# Patient Record
Sex: Male | Born: 1935 | Race: White | Hispanic: No | Marital: Married | Smoking: Former smoker
Health system: Southern US, Community
[De-identification: ages and names within clinical notes are randomized; demographics above are authoritative.]

## PROBLEM LIST (undated history)

## (undated) DIAGNOSIS — N400 Enlarged prostate without lower urinary tract symptoms: Secondary | ICD-10-CM

## (undated) DIAGNOSIS — I472 Ventricular tachycardia, unspecified: Secondary | ICD-10-CM

## (undated) DIAGNOSIS — R55 Syncope and collapse: Secondary | ICD-10-CM

## (undated) DIAGNOSIS — I679 Cerebrovascular disease, unspecified: Secondary | ICD-10-CM

## (undated) DIAGNOSIS — I10 Essential (primary) hypertension: Secondary | ICD-10-CM

## (undated) DIAGNOSIS — I459 Conduction disorder, unspecified: Secondary | ICD-10-CM

## (undated) HISTORY — DX: Essential (primary) hypertension: I10

## (undated) HISTORY — DX: Ventricular tachycardia: I47.2

## (undated) HISTORY — PX: COLONOSCOPY: SHX174

## (undated) HISTORY — DX: Syncope and collapse: R55

## (undated) HISTORY — DX: Cerebrovascular disease, unspecified: I67.9

## (undated) HISTORY — DX: Conduction disorder, unspecified: I45.9

## (undated) HISTORY — DX: Benign prostatic hyperplasia without lower urinary tract symptoms: N40.0

## (undated) HISTORY — DX: Ventricular tachycardia, unspecified: I47.20

---

## 1996-03-28 DIAGNOSIS — R55 Syncope and collapse: Secondary | ICD-10-CM

## 1996-03-28 HISTORY — DX: Syncope and collapse: R55

## 2002-02-07 ENCOUNTER — Other Ambulatory Visit: Admission: RE | Admit: 2002-02-07 | Discharge: 2002-02-07 | Payer: Self-pay | Admitting: Dermatology

## 2006-09-05 ENCOUNTER — Ambulatory Visit: Payer: Self-pay | Admitting: Internal Medicine

## 2006-09-05 ENCOUNTER — Encounter (INDEPENDENT_AMBULATORY_CARE_PROVIDER_SITE_OTHER): Payer: Self-pay | Admitting: Internal Medicine

## 2006-09-05 ENCOUNTER — Ambulatory Visit (HOSPITAL_COMMUNITY): Admission: RE | Admit: 2006-09-05 | Discharge: 2006-09-05 | Payer: Self-pay | Admitting: Internal Medicine

## 2007-01-26 ENCOUNTER — Ambulatory Visit: Payer: Self-pay | Admitting: Cardiology

## 2007-02-14 ENCOUNTER — Ambulatory Visit: Payer: Self-pay | Admitting: Cardiology

## 2007-02-19 ENCOUNTER — Ambulatory Visit (HOSPITAL_COMMUNITY): Admission: RE | Admit: 2007-02-19 | Discharge: 2007-02-19 | Payer: Self-pay | Admitting: Cardiology

## 2007-02-21 ENCOUNTER — Ambulatory Visit: Payer: Self-pay | Admitting: Cardiology

## 2007-03-05 ENCOUNTER — Ambulatory Visit: Payer: Self-pay | Admitting: Cardiology

## 2007-05-08 ENCOUNTER — Ambulatory Visit: Payer: Self-pay | Admitting: Cardiology

## 2008-05-05 ENCOUNTER — Encounter: Payer: Self-pay | Admitting: Cardiology

## 2008-05-05 LAB — CONVERTED CEMR LAB
AST: 18 units/L
Albumin: 4.7 g/dL
Alkaline Phosphatase: 50 units/L
BUN: 22 mg/dL
Chloride: 96 meq/L
Cholesterol: 124 mg/dL
HCT: 40.9 %
LDL Cholesterol: 62 mg/dL
MCV: 92 fL
Potassium: 4 meq/L
Sodium: 139 meq/L
WBC: 5.3 10*3/uL

## 2008-06-13 ENCOUNTER — Inpatient Hospital Stay (HOSPITAL_COMMUNITY): Admission: AD | Admit: 2008-06-13 | Discharge: 2008-06-15 | Payer: Self-pay | Admitting: Internal Medicine

## 2008-07-21 ENCOUNTER — Ambulatory Visit (HOSPITAL_COMMUNITY): Admission: RE | Admit: 2008-07-21 | Discharge: 2008-07-21 | Payer: Self-pay | Admitting: Internal Medicine

## 2008-09-17 ENCOUNTER — Encounter (INDEPENDENT_AMBULATORY_CARE_PROVIDER_SITE_OTHER): Payer: Self-pay | Admitting: *Deleted

## 2009-05-14 ENCOUNTER — Other Ambulatory Visit: Admission: RE | Admit: 2009-05-14 | Discharge: 2009-05-14 | Payer: Self-pay | Admitting: General Surgery

## 2009-05-18 ENCOUNTER — Encounter (INDEPENDENT_AMBULATORY_CARE_PROVIDER_SITE_OTHER): Payer: Self-pay | Admitting: *Deleted

## 2009-05-18 LAB — CONVERTED CEMR LAB
AST: 18 units/L
Albumin: 4.6 g/dL
BUN: 16 mg/dL
Cholesterol: 122 mg/dL
Glucose, Bld: 115 mg/dL
HDL: 42 mg/dL
Sodium: 140 meq/L
Total Protein: 7 g/dL
Triglycerides: 98 mg/dL

## 2009-06-16 DIAGNOSIS — N400 Enlarged prostate without lower urinary tract symptoms: Secondary | ICD-10-CM | POA: Insufficient documentation

## 2009-06-18 ENCOUNTER — Encounter: Payer: Self-pay | Admitting: Cardiology

## 2009-06-18 DIAGNOSIS — Z8669 Personal history of other diseases of the nervous system and sense organs: Secondary | ICD-10-CM

## 2009-06-18 DIAGNOSIS — E785 Hyperlipidemia, unspecified: Secondary | ICD-10-CM

## 2009-06-18 DIAGNOSIS — I459 Conduction disorder, unspecified: Secondary | ICD-10-CM

## 2009-06-19 ENCOUNTER — Ambulatory Visit: Payer: Self-pay | Admitting: Cardiology

## 2009-06-19 DIAGNOSIS — R079 Chest pain, unspecified: Secondary | ICD-10-CM

## 2009-06-25 ENCOUNTER — Encounter (INDEPENDENT_AMBULATORY_CARE_PROVIDER_SITE_OTHER): Payer: Self-pay | Admitting: *Deleted

## 2009-06-25 ENCOUNTER — Ambulatory Visit: Payer: Self-pay | Admitting: Cardiology

## 2009-06-26 ENCOUNTER — Encounter (INDEPENDENT_AMBULATORY_CARE_PROVIDER_SITE_OTHER): Payer: Self-pay | Admitting: *Deleted

## 2009-07-08 ENCOUNTER — Ambulatory Visit: Payer: Self-pay | Admitting: Cardiology

## 2009-12-28 ENCOUNTER — Ambulatory Visit: Payer: Self-pay | Admitting: Cardiology

## 2009-12-28 DIAGNOSIS — R002 Palpitations: Secondary | ICD-10-CM

## 2009-12-30 ENCOUNTER — Ambulatory Visit: Payer: Self-pay | Admitting: Cardiology

## 2009-12-30 ENCOUNTER — Encounter: Payer: Self-pay | Admitting: Cardiology

## 2009-12-30 ENCOUNTER — Ambulatory Visit (HOSPITAL_COMMUNITY): Admission: RE | Admit: 2009-12-30 | Discharge: 2009-12-30 | Payer: Self-pay | Admitting: Cardiology

## 2010-04-18 ENCOUNTER — Encounter: Payer: Self-pay | Admitting: Cardiology

## 2010-04-29 NOTE — Assessment & Plan Note (Signed)
Summary: F/U GXT TO BE DONE 3/30/TG      Allergies Added:   Visit Type:  Follow-up Primary Provider:  Dr. Carylon Perches   History of Present Illness: Patrick [name] returns to the office as scheduled for continued assessment and treatment of sinus bradycardia, chest discomfort and exercise induced ventricular tachycardia.  Since his last visit, he has done very well.  Chest discomfort has resolved.  He now believes this was related to a upper respiratory infection.  His treadmill test was generally good with no ischemic EKG changes, and no elicited chest discomfort, a good heart rate response, good exercise tolerance but a few runs of nonsustained ventricular tachycardia near peak exercise.  He detected palpitations at the time of his stress test, but does not recall those occurring with his usual daily activities.  Current Medications (verified): 1)  Chlorthalidone 25 Mg Tabs (Chlorthalidone) .... Take 1/2 Tablet Daily 2)  Amlodipine Besylate 5 Mg Tabs (Amlodipine Besylate) .... Take 1 Tablet By Mouth Once Daily 3)  Flomax 0.4 Mg Caps (Tamsulosin Hcl) .... Take 1 Tab Daily 4)  Aspir-Low 81 Mg Tbec (Aspirin) .... Take 1 Tab Daily 5)  Simvastatin 40 Mg Tabs (Simvastatin) .... Take 1 Tab Daily  Allergies (verified): 1)  ! Diltiazem Hcl Er Beads  Past History:  Past Medical History: Syncope-1998; associated with PSVT Conduction system disease-sinus bradycardia; junctional rhythm but when treated with diltiazem Exercise-induced ventricular tachycardia CEREBROVASCULAR DISEASE-plaque without focal disease on duplex scanning in 11/08 HYPERTENSION (ICD-401.9) BENIGN PROSTATIC HYPERTROPHY, HX OF (ICD-V13.8) AODM-diet controlled  Review of Systems  The patient denies chest pain, syncope, dyspnea on exertion, and peripheral edema.    Vital Signs:  Patient profile:   77 year old male Weight:      197 pounds Pulse rate:   49 / minute BP sitting:   122 / 43  (right arm)  Vitals Entered  By: Dreama Saa, CNA (July 08, 2009 11:26 AM)  Physical Exam  General:    Proportionate weight and height; well developed; no acute distress: Neck-No JVD; no carotid bruits: Lungs-No tachypnea, no rales; no rhonchi; no wheezes: Cardiovascular-normal PMI; normal S1 and S2: minimal systolic murmur Extremities- no edema:     Impression & Recommendations:  Problem # 1:  CHEST PAIN (ICD-786.50) Symptoms have resolved.  Is negative stress test at good workload and heart rate is reassuring and tends to exclude important ischemic heart disease.  He will call him should symptoms recur or should he develop other worrisome symptoms such as dyspnea or syncope.  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  I considered treatment with beta blocker for his exercise-induced ventricular tachycardia, but this is would be of uncertain benefit and might lead to an intolerable bradycardia.  Since he does not exercise to the level at which his arrhythmia occurred, I do not believe that treatment is necessary unless he develops worrisome symptoms such as syncope.  Problem # 3:  HYPERLIPIDEMIA (ICD-272.4) Recent laboratory results from Dr. Alonza Smoker office were obtained and reviewed.  Control of hyperlipidemia is excellent.  Current medication will be continued.  Control of hypertension is also superb.  Patrick [name] was congratulated on his efforts to maintain his health.  I will plan to see him again in one year.  Patient Instructions: 1)  Your physician recommends that you schedule a follow-up appointment in: 1  year 2)  Your physician recommends that you continue on your current medications as directed. Please refer to the Current Medication list given to you today.

## 2010-04-29 NOTE — Assessment & Plan Note (Signed)
Summary: per pt request/  having some problems /tg    Visit Type:  Follow-up Primary Provider:  Dr. Carylon Perches   History of Present Illness: [name] is a pleasant 77 y/o CM with history of exercise induced ventricular tachycardia, hypertension, conduction system disease with junctional rhythm on cardiazem and daibetes who is here for follow-up after being seen by Dr. Dietrich Pates in April of 2011.  He continues to have frequent daily palpations with increased frequency during exercise. States that when he climbed some stairs recently he felt his heart racing and thumping, caused near syncope-"everything went white in front of my eyes and I could not see well and felt weakenss.". He rested for about a minute and the sensation passed.  He did not have repeated symptoms of near syncope since that time.  He has recurrent palpatations almost daily.    He had a stress test in March of 2011 demonstrating  asymptomatic PVCs and nonsustained VT during exercise; patient's typical exertional chest discomfort not elicited despite a high perceived level of exercise. Review of records do no show that he has had an ECHO in the past or TSH drawn.  Current Medications (verified): 1)  Chlorthalidone 25 Mg Tabs (Chlorthalidone) .... Take 1/2 Tablet Daily 2)  Amlodipine Besylate 5 Mg Tabs (Amlodipine Besylate) .... Take 1 Tablet By Mouth Once Daily 3)  Flomax 0.4 Mg Caps (Tamsulosin Hcl) .... Take 1 Tab Daily 4)  Aspir-Low 81 Mg Tbec (Aspirin) .... Take 1 Tab Daily 5)  Simvastatin 40 Mg Tabs (Simvastatin) .... Take 1 Tab Daily  Allergies: 1)  ! Diltiazem Hcl Er Beads  Comments:  Nurse/Medical Assistant: reviewed med list with patient no meds no list  Past History:  Past medical, surgical, family and social histories (including risk factors) reviewed, and no changes noted (except as noted below).  Past Medical History: Reviewed history from 07/08/2009 and no changes required. Syncope-1998; associated  with PSVT Conduction system disease-sinus bradycardia; junctional rhythm but when treated with diltiazem Exercise-induced ventricular tachycardia CEREBROVASCULAR DISEASE-plaque without focal disease on duplex scanning in 11/08 HYPERTENSION (ICD-401.9) BENIGN PROSTATIC HYPERTROPHY, HX OF (ICD-V13.8) AODM-diet controlled  Past Surgical History: Reviewed history from 06/16/2009 and no changes required. colonoscopy cath 10 yrs ago  Family History: Reviewed history from 06/18/2009 and no changes required. Father:deceased age 12 with Alzheimer's Mother:deceased age 66 brain anyurism Siblings: 1 sister-healthy  Social History: Reviewed history from 06/16/2009 and no changes required. Retired  Married  Regular Exercise - yes patient has 1 child  Review of Systems       Palpatations   Vital Signs:  Patient profile:   77 year old male Weight:      193 pounds BMI:     26.27 Pulse rate:   56 / minute BP sitting:   145 / 72  (right arm)  Vitals Entered By: Dreama Saa, CNA (December 28, 2009 11:37 AM)  Physical Exam  General:  Well developed, well nourished, in no acute distress. Neck:  Neck supple, no JVD. No masses, thyromegaly or abnormal cervical nodes. Lungs:  Clear bilaterally to auscultation and percussion. Heart:  Bradycardic with occasional extra systoles. No MRG. Abdomen:  Bowel sounds positive; abdomen soft and non-tender without masses, organomegaly, or hernias noted. No hepatosplenomegaly. Msk:  Back normal, normal gait. Muscle strength and tone normal. Pulses:  pulses normal in all 4 extremities Extremities:  No clubbing or cyanosis. Neurologic:  Alert and oriented x 3. Psych:  Normal affect.   EKG  Procedure  date:  12/28/2009  Findings:      Sinus bradycardia with rate of:57 bpm  PVC's noted.    Impression & Recommendations:  Problem # 1:  PALPITATIONS (ICD-785.1) Plan echocardiogram for LV fx with recurrent palpatations.  Review of labs drawn in  march do not show TSH.  Would recommend this on next visit to primary if not done recently.  Will review these records and make further recommendations after ECHO His updated medication list for this problem includes:    Amlodipine Besylate 5 Mg Tabs (Amlodipine besylate) .Marland Kitchen... Take 1 tablet by mouth once daily    Aspir-low 81 Mg Tbec (Aspirin) .Marland Kitchen... Take 1 tab daily  Problem # 2:  HYPERTENSION (ICD-401.9) Moderately controlled on current medication regimine.  Last BP on office visit. 122/43  No changes in medications at this time.    Chlorthalidone 25 Mg Tabs (Chlorthalidone) .Marland Kitchen... Take 1/2 tablet daily    Amlodipine Besylate 5 Mg Tabs (Amlodipine besylate) .Marland Kitchen... Take 1 tablet by mouth once daily    Aspir-low 81 Mg Tbec (Aspirin) .Marland Kitchen... Take 1 tab daily  Other Orders: 2-D Echocardiogram (2D Echo)  Patient Instructions: 1)  Your physician recommends that you schedule a follow-up appointment in: 6 months 2)  Your physician recommends that you continue on your current medications as directed. Please refer to the Current Medication list given to you today. 3)  Your physician has requested that you have an echocardiogram.  Echocardiography is a painless test that uses sound waves to create images of your heart. It provides your doctor with information about the size and shape of your heart and how well your heart's chambers and valves are working.  This procedure takes approximately one hour. There are no restrictions for this procedure.

## 2010-04-29 NOTE — Letter (Signed)
Summary: [place] [employer] [facility], [facility] at [facility]  [address], [place] [address]   Phone: [phone]  Fax: [phone]      June 25, 2009 MRN: [id number]   GERADO NABERS [address] [place], [place]  [address]   Dear Mr. Plamondon,  Your test ordered by Selena Batten has been reviewed by your physician (or physician assistant) and was found to be normal or stable. Your physician (or physician assistant) felt no changes were needed at this time.  ____ Echocardiogram  _x___ Cardiac Stress Test  ____ Lab Work  ____ Peripheral vascular study of arms, legs or neck  ____ CT scan or X-ray  ____ Lung or Breathing test  ____ Other:  No change in medical treatment at this time, per Dr. Dietrich Pates.  Thank you, Charlene Cowdrey Allyne Gee RN    Simms Bing, MD, Lenise Arena.C.Gaylord Shih, MD, F.A.C.C Lewayne Bunting, MD, F.A.C.C Nona Dell, MD, F.A.C.C Charlton Haws, MD, Lenise Arena.C.C

## 2010-04-29 NOTE — Assessment & Plan Note (Signed)
Summary: [name]      Allergies Added:   Visit Type:  Follow-up Primary Provider:  Dr. Carylon Perches   History of Present Illness: [name] is seen at his request today for evaluation of chest discomfort.  This gentleman was due back to see me more than one year ago for continuing assessment and treatment of cardiovascular risk factors including hypertension and dyslipidemia.  Since he was feeling fine, he elected not to keep that appointment, a decision with which I could not really take issue.  In recent months, he has noted mid substernal chest burning with moderate exertion.  There is no associated dyspnea or diaphoresis.  Symptoms resolved within a matter of minutes with rest.  On one occasion, he felt compelled to continue exercising for a minute or 2, and discomfort continued.  Blood pressure control has been good.  He is unaware of recent cholesterol values.  A lipid profile one year ago was excellent.  On one occasion, [place] not exercising to any significant extent, he developed a whiteout of his vision, similar to symptoms that he described the last time I evaluated him.  He assumed a kneeling position, and symptoms resolved.  EKG  Procedure date:  06/19/2009  Findings:      Normal sinus rhythm First degree AV block Frequent PACs Otherwise normal Comparison with prior study of 01/26/07, PACs are now present.   Current Medications (verified): 1)  Chlorthalidone 25 Mg Tabs (Chlorthalidone) .... Take 1/2 Tablet Daily 2)  Amlodipine Besylate 5 Mg Tabs (Amlodipine Besylate) .... Take 1 Tablet By Mouth Once Daily 3)  Flomax 0.4 Mg Caps (Tamsulosin Hcl) .... Take 1 Tab Daily 4)  Aspir-Low 81 Mg Tbec (Aspirin) .... Take 1 Tab Daily 5)  Simvastatin 40 Mg Tabs (Simvastatin) .... Take 1 Tab Daily  Allergies (verified): 1)  ! Diltiazem Hcl Er Beads  Past History:  PMH, FH, and Social History reviewed and updated.  Review of Systems       see  history of present illness.  Vital Signs:  Patient profile:   77 year old male Height:      72 inches Weight:      198 pounds BMI:     26.95 Pulse rate:   45 / minute BP sitting:   145 / 72  (right arm)  Vitals Entered By: Dreama Saa, CNA (June 19, 2009 2:23 PM)  Physical Exam  General:    Proportionate weight and height; well developed; no acute distress: And a bruise and a long and a full   Neck-No JVD; no carotid bruits: Lungs-No tachypnea, no rales; no rhonchi; no wheezes: Cardiovascular-normal PMI; normal S1 and S2: minimal systolic murmur Abdomen-BS normal; soft and non-tender without masses or organomegaly:  Musculoskeletal-No deformities, no cyanosis or clubbing: Neurologic-Normal cranial nerves; symmetric strength and tone:  Skin-Warm, no significant lesions: Extremities-Nl distal pulses; no edema:   EKG cover  Impression & Recommendations:  Problem # 1:  CHEST PAIN (ICD-786.50) By patient's description, his chest discomfort is consistent with angina.  He underwent cardiac catheterization in 1998 at which time no coronary disease was present.  We will proceed with a standard treadmill stress test in an attempt to rule out ischemia and to assess exercise capacity.  Problem # 2:  HYPERTENSION (ICD-401.9) Blood pressure control is good.  Patient does have borderline significant orthostatic hypotension with at least one episode of symptoms compatible with cerebral hypoperfusion.  This is similar to evaluation  some years ago.  He already is aware of behavioral techniques to mitigate both symptoms and the danger of syncope and will continue to utilize these.  He was also cautioned about maintaining adequate fluid and salt intake and avoided environments that will cause significant dehydration.  Problem # 3:  HYPERLIPIDEMIA (ICD-272.4)  Lipid profile from one year ago was excellent. Current medication will be continued.  If stress testing is negative or low risk, I will  plan to see this nice gentleman again in one year.  Other Orders: Treadmill (Treadmill) T-CBC w/Diff ([id number]) T-Comprehensive Metabolic Panel [phone]) T-TSH ([phone])  Patient Instructions: 1)  Your physician recommends that you schedule a follow-up appointment in: after testing 2)  Your physician recommends that you return for lab work in: today 3)  Your physician has requested that you have an exercise tolerance test.  For further information please visit [email or web address]  Please also follow instruction sheet, as given.

## 2010-04-29 NOTE — Miscellaneous (Signed)
Summary: labs cmp,lipids,A1c,05/18/2009  Clinical Lists Changes  Observations: Added new observation of CALCIUM: 9.0 mg/dL (16/12/9602 5:40) Added new observation of ALBUMIN: 4.6 g/dL (98/01/9146 8:29) Added new observation of PROTEIN, TOT: 7.0 g/dL (56/21/3086 5:78) Added new observation of SGPT (ALT): 16 units/L (05/18/2009 8:38) Added new observation of SGOT (AST): 18 units/L (05/18/2009 8:38) Added new observation of ALK PHOS: 59 units/L (05/18/2009 8:38) Added new observation of CREATININE: 0.95 mg/dL (46/96/2952 8:41) Added new observation of BUN: 16 mg/dL (32/44/0102 7:25) Added new observation of BG RANDOM: 115 mg/dL (36/64/4034 7:42) Added new observation of CO2 PLSM/SER: 29 meq/L (05/18/2009 8:38) Added new observation of CL SERUM: 101 meq/L (05/18/2009 8:38) Added new observation of K SERUM: 3.8 meq/L (05/18/2009 8:38) Added new observation of NA: 140 meq/L (05/18/2009 8:38) Added new observation of LDL: 60 mg/dL (59/56/3875 6:43) Added new observation of HDL: 42 mg/dL (32/95/1884 1:66) Added new observation of TRIGLYC TOT: 98 mg/dL (09/25/1599 0:93) Added new observation of CHOLESTEROL: 122 mg/dL (23/55/7322 0:25) Added new observation of HGBA1C: 140 % (05/18/2009 8:38)

## 2010-07-08 LAB — COMPREHENSIVE METABOLIC PANEL
AST: 27 U/L (ref 0–37)
Alkaline Phosphatase: 70 U/L (ref 39–117)
BUN: 19 mg/dL (ref 6–23)
Creatinine, Ser: 1.08 mg/dL (ref 0.4–1.5)
GFR calc Af Amer: >60 mL/min (ref >60)
GFR calc non Af Amer: >60 mL/min (ref >60)
Glucose, Bld: 195 mg/dL — ABNORMAL HIGH (ref 70–99)
Sodium: 129 mEq/L — ABNORMAL LOW (ref 135–145)
Total Protein: 7.4 g/dL (ref 6.0–8.3)

## 2010-07-08 LAB — GLUCOSE, CAPILLARY
Glucose-Capillary: 215 mg/dL — ABNORMAL HIGH (ref 70–99)
Glucose-Capillary: 95 mg/dL (ref 70–99)

## 2010-07-08 LAB — BASIC METABOLIC PANEL
BUN: 14 mg/dL (ref 6–23)
Calcium: 8.6 mg/dL (ref 8.4–10.5)
Chloride: 99 mEq/L (ref 96–112)
Creatinine, Ser: 0.99 mg/dL (ref 0.4–1.5)
GFR calc Af Amer: >60 mL/min (ref >60)
GFR calc non Af Amer: >60 mL/min (ref >60)
Glucose, Bld: 124 mg/dL — ABNORMAL HIGH (ref 70–99)
Potassium: 3.9 mEq/L (ref 3.5–5.1)

## 2010-07-08 LAB — CULTURE, BLOOD (ROUTINE X 2)
Culture: NO GROWTH
Culture: NO GROWTH
Report Status: 3242010
Report Status: 3242010

## 2010-07-08 LAB — DIFFERENTIAL
Lymphs Abs: 0.4 10*3/uL — ABNORMAL LOW (ref 0.7–4.0)
Monocytes Absolute: 0.5 10*3/uL (ref 0.1–1.0)
Neutro Abs: 10.6 10*3/uL — ABNORMAL HIGH (ref 1.7–7.7)
Neutrophils Relative %: 92 % — ABNORMAL HIGH (ref 43–77)

## 2010-07-08 LAB — CBC
HCT: 38.5 % — ABNORMAL LOW (ref 39.0–52.0)
Hemoglobin: 13.3 g/dL (ref 13.0–17.0)
WBC: 11.5 10*3/uL — ABNORMAL HIGH (ref 4.0–10.5)

## 2010-08-10 NOTE — Op Note (Signed)
NAMEMICAH, Patrick [name]               ACCOUNT NO.:  [id number]   MEDICAL RECORD NO.:  [id number]          PATIENT TYPE:  AMB   LOCATION:  DAY                           FACILITY:  [facility]   PHYSICIAN:  Lionel December, M.D.    DATE OF BIRTH:  [birth year]   DATE OF PROCEDURE:  09/05/2006  DATE OF DISCHARGE:                               OPERATIVE REPORT   PROCEDURE:  Colonoscopy.   INDICATION:  [name] is a 77 year old Caucasian male who is referred for  colonoscopy.  He has had polyps removed in the past.  Furthermore, his  family history is positive for colon carcinoma in his mother.  He does  complain of constipation.  He states his stools are getting hard and he  has to strain. He goes every 3-4 days which has been his pattern for  years but the consistency has changed recently.  The procedure risks  were reviewed the patient and informed consent was obtained .The  procedure risks were reviewed with the patient and informed consent was  obtained.   MEDS FOR CONSCIOUS SEDATION:  Demerol 50 mg IV, Versed 5 mg IV.   FINDINGS:  The procedure was performed in the endoscopy suite.  The  patient's vital signs and O2 sat were monitored during the procedure and  remained stable.  The patient was placed in the left lateral position  and rectal examination performed.  No abnormality was noted on external  or digital exam.  The Pentax videoscope was placed in the rectum and  advanced under vision into the sigmoid colon and beyond.  The  preparation was satisfactory.  A very redundant colon.  The scope was  passed into the cecum which was identified by the appendiceal orifice  and ileocecal valve.  There was a single small polyp involving blunt end  cecum which was ablated via cold biopsy.  There was a small diverticulum  at the ascending colon and another one at the hepatic flexure and there  was a polyp on its rim which was ablated via cold biopsy.  A third polyp  was removed from the mid transverse  colon in a similar fashion.  The  rest of the mucosa was unremarkable.  There was another small  diverticulum at the sigmoid colon.  Rectal mucosa was normal.  The scope  was retroflexed to examine the anorectal junction which was  unremarkable.  The endoscope was straightened and withdrawn.  The  patient tolerated the procedure well.   FINAL DIAGNOSIS:  1. Three small polyps ablated via cold biopsy, one from cecum, another      one from hepatic flexure located at the rim of a diverticulum, and      third polyp was at transverse colon.  2. Three small diverticula, one at the ascending colon, the second one      at hepatic flexure, and third one at sigmoid.  3. External hemorrhoids.   RECOMMENDATIONS:  Standard instructions given.  He will continue high  fiber diet and Metamucil, as before.  He will try Colace, 2 tablets at  bedtime for two weeks.  If it does not work, he will use lactulose, 2  tablespoonful q.h.s. or q.o.d., prescription given for one quart with  refills for up to one year.   I will be contacting the patient with results of biopsy and he should  consider having his next colonoscopy in five years for now.      Lionel December, M.D.  Electronically Signed     NR/MEDQ  D:  09/05/2006  T:  09/05/2006  Job:  [id number]   cc:   Kingsley Callander. Ouida Sills, MD  Fax: [phone]

## 2010-08-10 NOTE — Letter (Signed)
March 05, 2007    Kingsley Callander. Ouida Sills, MD  [address]  [place], [place] [address]   RE:  RHYTHM, WIGFALL  MRN:  [id number]  /  DOB:  05-18-1928   Dear Channing Mutters:   Mr. Bossman returns to the office for continued assessment and  treatment of spells that potentially represent cerebral hypoperfusion.  He performed a stress test with good results.  Carotid ultrasound was  negative.  An event recorder was revealing.  He had sinus bradycardiac  typically with frequent PVC and PACs.  However, when he reported  symptoms, he was in a junctional rhythm.   Medications are unchanged from his last visit.   EXAM:  Pleasant gentleman in no acute distress.  The weight is 202, 1  pound more than at his last visit.  Blood pressure 160/60, heart rate 60 and irregular, respirations 16.  NECK:  No jugular venous distension; normal carotid upstrokes without  bruits.  LUNGS:  Clear.  CARDIAC:  Normal first and second heart sounds; modest systolic murmur.  ABDOMEN:  Soft and nontender; no organomegaly.  EXTREMITIES:  1/2+ ankle edema.   Mr. Eckerman has a component of conduction system disease exacerbated by  Diltiazem.  His junctional rhythms are probably not of significant  concern and are not causing hypotension, but they are causing symptoms.  We will stop Diltiazem and add amlodipine and chlorthalidone for his  hypertension.  He will monitor blood pressure at home and return for a  chemistry profile in 1 month, and a return office visit in 2 months.    Sincerely,      Gerrit Friends. Dietrich Pates, MD, [facility]  Electronically Signed    RMR/MedQ  DD: 03/05/2007  DT: 03/05/2007  Job #: [id number]

## 2010-08-10 NOTE — Letter (Signed)
May 08, 2007    Kingsley Callander. Ouida Sills, MD  [address]  [place], [place] [address]   RE:  JABRON, WEESE  MRN:  [id number]  /  DOB:  10-16-1928   Dear Channing Mutters:   Mr. Bolin returns to the office for continued assessment and  treatment of hypertension and dyslipidemia.  Since his last visit, he  has felt fine.  The symptoms that he was experiencing with diltiazem  have resolved.  He had a follow-up chemistry profile, which was normal.  Carotid ultrasound showed atherosclerosis but no focal stenosis.   Medications are unchanged from his last visit except for the  discontinuation of diltiazem and the addition of amlodipine 5 mg daily  and chlorthalidone 12.5 mg daily.   On exam, , a pleasant, proportionate gentleman in no acute distress.  The weight is 200 pounds, 2 pounds less than at his last visit.  Blood  pressure 110/75, heart rate 60 and regular, respirations 18.  NECK:  No jugular venous distention; minimal right carotid bruit.  LUNGS:  Clear.  CARDIAC:  Normal first and second heart sounds; fourth heart sound  present.  ABDOMEN:  Soft and nontender; no organomegaly.  EXTREMITIES:  Trace edema.   IMPRESSION:  Mr. Neis is doing well with his current therapy.  A  recent lipid profile showed an LDL level of 99.  I have asked him to  increase his simvastatin to 40 mg daily for slightly better control but  have assured him that he is doing very well.  I will plan to see this  nice gentleman again in 9 months.    Sincerely,      Gerrit Friends. Dietrich Pates, MD, [facility]  Electronically Signed    RMR/MedQ  DD: 05/08/2007  DT: 05/09/2007  Job #: [id number]

## 2010-08-10 NOTE — Discharge Summary (Signed)
Patrick, [name] NO.:  [id number]   MEDICAL RECORD NO.:  [id number]          PATIENT TYPE:  INP   LOCATION:  A313                          FACILITY:  [facility]   PHYSICIAN:  Kingsley Callander. Ouida Sills, MD       DATE OF BIRTH:  02-Feb-1929   DATE OF ADMISSION:  06/13/2008  DATE OF DISCHARGE:  03/21/2010LH                               DISCHARGE SUMMARY   DISCHARGE DIAGNOSES:  1. Community-acquired pneumonia.  2. Altered mental status due to pneumonia.  3. Type 2 diabetes.  4. Hypertension.  5. Hyperlipidemia.  6. Benign prostatic hypertrophy.  7. History of supraventricular tachycardia.   HOSPITAL COURSE:  This patient is a 77 year old male who presented with  cough, fever, and confusion.  His white count was 11.5 with 92 segs.  His chest x-ray revealed a right upper lobe pneumonia.  He was evaluated  with a CT scan of the brain due to confusion.  This was normal.  Blood  cultures were negative.  He was treated with ceftriaxone and  azithromycin.  His fever resolved.  His mental status cleared promptly.  He was breathing comfortably and oxygenating well and was felt to be  stable for discharge home on the 21st.  He will continue oral courses of  cefuroxime and azithromycin.   DISCHARGE MEDICATIONS:  1. Cefuroxime 500 mg b.i.d. for 8 more days.  2. Azithromycin 250 mg daily for 3 more days.  3. Norvasc 5 mg daily.  4. Chlorthalidone 12.5 mg daily.  5. Zocor 40 mg daily.  6. Hydrocodone p.r.n.   He will be seen in followup in the office in a week.  He will have  followup chest x-ray in 1 month.      Kingsley Callander. Ouida Sills, MD  Electronically Signed     ROF/MEDQ  D:  06/15/2008  T:  06/15/2008  Job:  [id number]

## 2010-08-10 NOTE — H&P (Signed)
NAMEKEASTON, PILE NO.:  [id number]   MEDICAL RECORD NO.:  [id number]          PATIENT TYPE:  INP   LOCATION:  A313                          FACILITY:  [facility]   PHYSICIAN:  Kingsley Callander. Ouida Sills, MD       DATE OF BIRTH:  1929/01/06   DATE OF ADMISSION:  06/13/2008  DATE OF DISCHARGE:  LH                              HISTORY & PHYSICAL   CHIEF COMPLAINT:  Cough.   HISTORY OF PRESENT ILLNESS:  This patient is a 77 year old white male,  who presented with cough and confusion.  He actually had improvement in  his cough recently with some hydrocodone cough syrup.  Symptoms started  about a week ago.  Upon awakening this morning though his wife noted  that he was confused, he seemed weaker yesterday.  He was brought to the  office and was found to have a fever of 100.  He was clearly different  in regard to his mental status.  He had extreme difficulty answering  simple questions.  He coughed, but produced limited sputum.  He denied  pain in the chest.  He had previously experienced a bout of pneumonia in  2002.   PAST MEDICAL HISTORY:  1. Type 2 diabetes.  2. Hypertension.  3. Hyperlipidemia.  4. BPH.  5. SVT.  6. Negative cardiac cath in 1994.   MEDICATIONS:  1. Hydrocodone cough syrup p.r.n.  2. Norvasc 5 mg daily.  3. Chlorthalidone 12.5 mg daily.  4. Zocor 40 mg daily.   ALLERGIES:  None.   SOCIAL HISTORY:  He does not smoke.  He does not drink.  He does not use  recreational drugs.   FAMILY HISTORY:  Mother had colon cancer, but died with a cerebral  aneurysm in 1993.  A sister had uterine cancer.   REVIEW OF SYSTEMS:  Noncontributory.   PHYSICAL EXAMINATION:  VITAL SIGNS:  Blood pressure 140/62, pulse 76,  respirations 16, temperature 100, and weight 198.  GENERAL:  Confused, but in no acute distress.  HEENT:  No scleral icterus.  Nose, TMs, and pharynx are unremarkable.  NECK:  Supple with no lymphadenopathy or thyromegaly.  LUNGS:  Clear.  HEART:   Regular with a grade 1 systolic murmur.  ABDOMEN:  Nontender.  No hepatosplenomegaly.  EXTREMITIES:  No cyanosis, clubbing, or edema.  NEURO:  He told me the month was November.  He has no focal weakness.  His gait is less steady, but he is able to ambulate independently.  His  face is symmetric.  His speech is intact.  LYMPH NODES:  No enlargement.   LABORATORY DATA:  His chest x-ray reveals a right upper lobe pneumonia.  His CT of the brain reveals no acute abnormalities, but he does have an  air-fluid level in his left maxillary sinus suggestive of sinusitis.  White count 11.5; hemoglobin 13.3; and platelets 220,000, 92 segs, 4  lymphs.  Sodium 129, potassium 3.6, glucose 195, BUN 19, creatinine  1.08, albumin 4.4, and calcium 9.2.   IMPRESSION:  1. Community-acquired pneumonia.  His confusion is likely related to  his infection.  He will be treated with IV antibiotics.  Blood      cultures have been obtained.  2. Hyponatremia.  3. Diabetes.  We will treat with sliding scale NovoLog.  4. Hypertension.  5. Hyperlipidemia.  6. Sinusitis.      Kingsley Callander. Ouida Sills, MD  Electronically Signed     ROF/MEDQ  D:  06/13/2008  T:  06/14/2008  Job:  [id number]

## 2010-08-10 NOTE — Letter (Signed)
January 26, 2007    Kingsley Callander. Ouida Sills, MD  [address]  [place], [place] [address]   RE:  GIOVANNE, NICKOLSON  MRN:  [id number]  /  DOB:  10-19-1928   Dear Channing Mutters:   It was my pleasure evaluating Patrick [name] in consultation today at your  request for near-syncope.  As you know, this nice gentleman first  presented approximately 10 years ago following a syncopal spell.  He  reportedly had PSVT at that time and was started on diltiazem.  He also  had an abnormal stress test and proceeded to cardiac catheterization  which was apparently normal.  We are seeking those records.   He subsequently did well but continued to have some spells.  He  describes a whitening of a portion of his visual field.  He is still  able to see some aspects of his environment.  There appears to be some  lightheadedness.  He notes weakness in his legs, but he has not fallen.  He has the sense that he needs to sit down or squat down.  Symptoms pass  within a minute or less.  He has typically had episodes with exertion,  although at times, he exerts himself without difficulty.  He also has  had episodes when he arises from a kneeling or bending position.   Otherwise, he has enjoyed generally excellent health.  He follows a  diabetic diet - I assume this was for fasting hyperglycemia.   PAST SURGICAL HISTORY:  None.   ALLERGIES:  NONE.   PAST MEDICAL HISTORY:  1. Dyslipidemia.  2. BPH.   CURRENT MEDICATIONS:  1. Diltiazem 240 mg daily.  2. Simvastatin 20 mg daily.  3. Aspirin 81 mg daily.  4. Flomax 0.4 mg daily.   SOCIAL HISTORY:  Retired.  Maintains an active lifestyle, including  walking on flat treadmill which does not bother him.  He is married with  1 adult child.   FAMILY HISTORY:  Father died at age 33 due to Alzheimer's.  Mother died  at 6 due to a brain aneurysm.  He has 1 sister who is alive and well.   REVIEW OF SYSTEMS:  Notable for the need for corrective lenses,  occasional brief  palpitations without other symptoms, intermittent  constipation.  All other systems reviewed and are negative.   PHYSICAL EXAMINATION:  GENERAL:  Pleasant gentleman in no acute  distress.  VITAL SIGNS:  The weight is 201.  Blood pressure 140/60, heart rate 60  and regular, respirations 15.  HEENT:  Mild arcus.  EOMs full.  Pupils are equal, round, and reactive  to light.  Normal oral mucosa.  NECK:  No jugular venous distension.  Normal carotid upstrokes without  bruits.  ENDOCRINE:  No thyromegaly.  HEMATOPOIETIC:  No adenopathy.  SKIN:  No significant lesions.  PSYCHIATRIC:  Alert and oriented.  Normal affect.  LUNGS:  Clear.  CARDIAC:  Normal first and second heart sounds.  Fourth heart sound  present.  Moderate basilar systolic ejection murmur.  ABDOMEN:  Soft and nontender.  No organomegaly.  EXTREMITIES:  No edema.  Normal distal pulses.  NEUROMUSCULAR:  Normal strength and tone.  Normal reflexes.   EKG:  Sinus bradycardia, left atrial enlargement, first-degree AV block,  somewhat prominent U waves, no prior tracing for comparison.   IMPRESSION:  Patrick [name] has spells that are somewhat difficult to  characterize.  This could represent cerebral hypoperfusion  with visual  disturbance and near-syncope, but I  am not entirely convinced.  We will  start with a carotid ultrasound study, a stress test, and an event  recorder.  Patrick [name] will return to the office after those tests have  been completed to determine if an etiology is suggested and whether  additional testing is warranted.   Thank you for sending this nice gentleman back to [place] after a long  hiatus.    Sincerely,      Gerrit Friends. Dietrich Pates, MD, [facility]  Electronically Signed    RMR/MedQ  DD: 01/26/2007  DT: 01/26/2007  Job #: [id number]

## 2010-09-24 ENCOUNTER — Other Ambulatory Visit: Payer: Self-pay | Admitting: Cardiology

## 2010-10-28 ENCOUNTER — Encounter: Payer: Self-pay | Admitting: Cardiology

## 2010-11-09 ENCOUNTER — Encounter: Payer: Self-pay | Admitting: Cardiology

## 2010-11-09 ENCOUNTER — Ambulatory Visit (INDEPENDENT_AMBULATORY_CARE_PROVIDER_SITE_OTHER): Payer: Medicare HMO | Admitting: Cardiology

## 2010-11-09 DIAGNOSIS — R079 Chest pain, unspecified: Secondary | ICD-10-CM

## 2010-11-09 DIAGNOSIS — I679 Cerebrovascular disease, unspecified: Secondary | ICD-10-CM

## 2010-11-09 DIAGNOSIS — Z8669 Personal history of other diseases of the nervous system and sense organs: Secondary | ICD-10-CM

## 2010-11-09 DIAGNOSIS — I951 Orthostatic hypotension: Secondary | ICD-10-CM

## 2010-11-09 DIAGNOSIS — G309 Alzheimer's disease, unspecified: Secondary | ICD-10-CM | POA: Insufficient documentation

## 2010-11-09 DIAGNOSIS — I472 Ventricular tachycardia, unspecified: Secondary | ICD-10-CM

## 2010-11-09 DIAGNOSIS — E785 Hyperlipidemia, unspecified: Secondary | ICD-10-CM

## 2010-11-09 DIAGNOSIS — Z87898 Personal history of other specified conditions: Secondary | ICD-10-CM

## 2010-11-09 DIAGNOSIS — E119 Type 2 diabetes mellitus without complications: Secondary | ICD-10-CM | POA: Insufficient documentation

## 2010-11-09 DIAGNOSIS — I459 Conduction disorder, unspecified: Secondary | ICD-10-CM

## 2010-11-09 DIAGNOSIS — I10 Essential (primary) hypertension: Secondary | ICD-10-CM

## 2010-11-09 DIAGNOSIS — R002 Palpitations: Secondary | ICD-10-CM

## 2010-11-09 NOTE — Assessment & Plan Note (Signed)
In the absence of benefit from Flomax and in the presence of significant orthostatic hypotension, I suggested that Patrick [name] discontinue that medication.  Urodynamic studies or a trial of an anti-androgen medication could be considered.  I will leave further assessment and treatment of this problem to Dr. Alonza Smoker discretion.

## 2010-11-09 NOTE — Assessment & Plan Note (Signed)
Excellent control of fasting hyperglycemia with diet therapy

## 2010-11-09 NOTE — Assessment & Plan Note (Addendum)
Blood pressure control has been good.  Amlodipine could be contributing to pedal edema, but this problem is mild and requires only occasional use of diuretic.  Again, with a good medical result from his current therapy, I am not inclined to change to other medications.

## 2010-11-09 NOTE — Assessment & Plan Note (Signed)
Excellent control of hyperlipidemia; continue current medical therapy.

## 2010-11-09 NOTE — Assessment & Plan Note (Signed)
Patient has had no symptoms to suggest significant recurrent bradycardia.  Treatment with a beta blocker could be considered for management of PACs and palpitations, but might lead to more significant problems.  Patient is satisfied to continue at his current level of symptoms with his current medication.

## 2010-11-09 NOTE — Assessment & Plan Note (Addendum)
30 mmHg drop in systolic blood pressure noted with standing in the absence of symptoms.  Patient does not descibe any symptoms that sound orthostatic, but the significant drop in blood pressure is of concern.  Patient alerted to report any dizziness occurring when he changes body position.  Flomax will be discontinued.  If orthostatic symptoms occur, replacement for his diuretic will probably be necessary.  He was advised that a severe salt restriction in his diet is not necessary.

## 2010-11-09 NOTE — Patient Instructions (Addendum)
Your physician recommends that you schedule a follow-up appointment in: 6 months Your physician has recommended you make the following change in your medication: stop flomax Occasional blood pressures at here or pharmacy write down results and return at next office visit

## 2010-11-09 NOTE — Progress Notes (Signed)
HPI : [name] returns to the office as scheduled for continued assessment and treatment of palpitations, conduction system disease and cardiovascular risk factors without known atherosclerosis.  He continues to do quite well, experiencing only minor palpitations, noting a sensation of movement in his chest and other vague chest sensations that are difficult to describe.  He's had no sustained symptoms.  He experienced one episode of anterior chest burning early one morning lasting approximately 30 minutes and resolving spontaneously.  He has had no new medical problems and remains active without other cardiopulmonary symptoms.  Specifically, he denies orthopnea, PND, exertional dyspnea and syncope.  He develops some lightheadedness when he overexerts, but this resolves quickly with rest.  He has had no orthostatic symptoms.  Approximately 2 months ago, treatment with Flomax was started for multiple episodes of nocturia.  There has been no change in his symptoms.  Current Outpatient Prescriptions on File Prior to Visit  Medication Sig Dispense Refill  . amLODipine (NORVASC) 5 MG tablet Take 5 mg by mouth daily.       Marland Kitchen aspirin 81 MG EC tablet Take 81 mg by mouth daily.        . chlorthalidone (HYGROTON) 25 MG tablet TAKE 1/2 TABLET BY MOUTH EVERY DAY.  15 tablet  0  . simvastatin (ZOCOR) 40 MG tablet Take 40 mg by mouth daily.           Allergies  Allergen Reactions  . Diltiazem Hcl     REACTION: Bradyarrhythmia      Past medical history, social history, and family history reviewed and updated.  ROS: See history of present illness.  PHYSICAL EXAM: BP 140/67  Pulse 63  Ht 5\' 11"  (1.803 m)  Wt 87.091 kg (192 lb)  BMI 26.78 kg/m2  SpO2 97%  General-Well developed; no acute distress Body habitus-proportionate weight and height Neck-No JVD; no carotid bruits Lungs-clear lung fields; resonant to percussion Cardiovascular-normal PMI; normal S1 and S2; regular rhythm with fairly frequent  prematures Abdomen-normal bowel sounds; soft and non-tender without masses or organomegaly Musculoskeletal-No deformities, no cyanosis or clubbing Neurologic-Normal cranial nerves; symmetric strength and tone Skin-Warm, no significant lesions Extremities-distal pulses intact; no edema  Laboratory: negative urinalysis, normal CBC, and normal BMet performed by Dr. Ouida [name] in 06/2010.  Lipid profile done as well with excellent results.  TSH-1.5.  EKG:  Normal sinus rhythm with sinus arrhythmia and PACs; first-degree AV block; left atrial abnormality; no previous tracing for comparison.  ASSESSMENT AND PLAN:

## 2010-11-11 ENCOUNTER — Encounter: Payer: Self-pay | Admitting: Cardiology

## 2011-05-25 ENCOUNTER — Encounter: Payer: Self-pay | Admitting: Physician Assistant

## 2011-05-25 ENCOUNTER — Ambulatory Visit (INDEPENDENT_AMBULATORY_CARE_PROVIDER_SITE_OTHER): Payer: Medicare Other | Admitting: Physician Assistant

## 2011-05-25 DIAGNOSIS — I4891 Unspecified atrial fibrillation: Secondary | ICD-10-CM

## 2011-05-25 DIAGNOSIS — I10 Essential (primary) hypertension: Secondary | ICD-10-CM

## 2011-05-25 DIAGNOSIS — I472 Ventricular tachycardia: Secondary | ICD-10-CM

## 2011-05-25 DIAGNOSIS — R079 Chest pain, unspecified: Secondary | ICD-10-CM

## 2011-05-25 NOTE — Assessment & Plan Note (Signed)
History of exercise-induced nonsustained V. Tach. Last treadmill on 06/25/09

## 2011-05-25 NOTE — Patient Instructions (Signed)
**Note De-identified  Obfuscation** Your physician has requested that you have en exercise stress myoview. For further information please visit [email or web address]. Please follow instruction sheet, as given.  Your physician recommends that you continue on your current medications as directed. Please refer to the Current Medication list given to you today.  Your physician recommends that you schedule a follow-up appointment in: 1 month  

## 2011-05-25 NOTE — Assessment & Plan Note (Signed)
Patient had an episode of chest pain that he said he had off-and-on for 2-3 years but has not had recently. He has multiple cardiac risk factors.We will assess with a stress Myoview.

## 2011-05-25 NOTE — Progress Notes (Signed)
HPI:  This is a 78 year old white male patient of Dr. Dietrich Pates who has a history of exercise-induced nonsustained V. Tach that has not been treated over the years, hypertension, diabetes mellitus, and hyperlipidemia. He also had some nonspecific chest pain over the years and had her treadmill test 2 years ago that was normal except for some exercise-induced nonsustained V. Tach.  The patient comes in today because of an episode of chest pain 3 days ago. When he laid down on his right side at night developed a chest tightness that went away within a few minutes. He was able to go to sleep but when he woke up the next morning he complained of a burning in his chest. This lasted about an hour and got better once he started moving around. He denied any radiation of the pain into his neck or shoulders he denied any dyspnea, diaphoresis, nausea, vomiting, dizziness, or pre-syncope.he said it during the night when he had the tightness he felt his arms were weak but that was the only symptom. He can walk 10 minutes on treadmill at an incline of 2.5 without any difficulty. He has not had any further chest pain since then. He says his heart was not beating irregular and he didn't have the palpitations when this occurred. He does not have a family history of coronary artery disease and is a nonsmoker.  Allergies  Allergen Reactions  . Diltiazem Hcl     REACTION: Bradyarrhythmia    Current Outpatient Prescriptions on File Prior to Visit  Medication Sig Dispense Refill  . amLODipine (NORVASC) 5 MG tablet Take 5 mg by mouth daily.       Marland Kitchen aspirin 81 MG EC tablet Take 81 mg by mouth daily.        . finasteride (PROSCAR) 5 MG tablet Take 5 mg by mouth daily.        . simvastatin (ZOCOR) 40 MG tablet Take 40 mg by mouth daily.          Past Medical History  Diagnosis Date  . Syncope 1998    assoc with PSVT  . Conduction disorder of the heart     Junctional rhythm and sinus bradycardia with diltiazem   .  Ventricular tachycardia     exercise induced  . Cerebrovascular disease     plaque w/o focal disease on duplex scanning 11/08  . Hypertension     Negative coronary angiography-approximately 2000  . Benign prostatic hypertrophy   . Diabetes mellitus     diet controlled     Past Surgical History  Procedure Date  . Colonoscopy     No family history on file.  History   Social History  . Marital Status: Married    Spouse Name: N/A    Number of Children: N/A  . Years of Education: N/A   Occupational History  . Not on file.   Social History Main Topics  . Smoking status: Former Smoker    Types: Cigarettes    Quit date: 04/11/1963  . Smokeless tobacco: Not on file  . Alcohol Use: Not on file  . Drug Use: Not on file  . Sexually Active: Not on file   Other Topics Concern  . Not on file   Social History Narrative   Married, 1 child; retired; gets regular exercise.     ROS:see history of present illness otherwise negative   PHYSICAL EXAM: Well-nournished, in no acute distress. Neck: No JVD, HJR, Bruit, or thyroid enlargement Lungs: No  tachypnea, clear without wheezing, rales, or rhonchi Cardiovascular: RRR, PMI not displaced, heart sounds distant, no murmurs, gallops, bruit, thrill, or heave. Abdomen: BS normal. Soft without organomegaly, masses, lesions or tenderness. Extremities: without cyanosis, clubbing or edema. Good distal pulses bilateral SKin: Warm, no lesions or rashes  Musculoskeletal: No deformities Neuro: no focal signs  BP 132/61  Pulse 68  Resp 18  Ht 6' (1.829 m)  Wt 200 lb (90.719 kg)  BMI 27.12 kg/m2   ZOX:WRUEAV sinus rhythm with first degree AV block PACs no acute change

## 2011-05-25 NOTE — Assessment & Plan Note (Signed)
stable °

## 2011-06-02 ENCOUNTER — Ambulatory Visit (INDEPENDENT_AMBULATORY_CARE_PROVIDER_SITE_OTHER): Payer: Medicare Other

## 2011-06-02 ENCOUNTER — Encounter (HOSPITAL_COMMUNITY)
Admission: RE | Admit: 2011-06-02 | Discharge: 2011-06-02 | Disposition: A | Discharge status: Home / Self Care | Payer: Medicare Other | Source: Ambulatory Visit | Attending: Physician Assistant | Admitting: Physician Assistant

## 2011-06-02 ENCOUNTER — Encounter (HOSPITAL_COMMUNITY): Payer: Self-pay

## 2011-06-02 DIAGNOSIS — R079 Chest pain, unspecified: Secondary | ICD-10-CM

## 2011-06-02 DIAGNOSIS — I4949 Other premature depolarization: Secondary | ICD-10-CM | POA: Insufficient documentation

## 2011-06-02 MED ORDER — TECHNETIUM TC 99M TETROFOSMIN IV KIT
10.0000 | PACK | Freq: Once | INTRAVENOUS | Status: AC | PRN
  Administered 2011-06-02: 10.7 via INTRAVENOUS

## 2011-06-02 MED ORDER — TECHNETIUM TC 99M TETROFOSMIN IV KIT
30.0000 | PACK | Freq: Once | INTRAVENOUS | Status: AC | PRN
  Administered 2011-06-02: 29 via INTRAVENOUS

## 2011-06-02 NOTE — Progress Notes (Signed)
Stress Lab Nurses Notes - [facility] Hawking  [facility] 06/02/2011  Reason for doing test: Chest Pain Type of test: Stress Myoview Nurse performing test: Parke Poisson, RN Nuclear Medicine Tech: Lyndel Pleasure Echo Tech: Not Applicable MD performing test: Ival Bible Family MD: Ouida Sills Test explained and consent signed: yes IV started: 22g jelco, Saline lock flushed, No redness or edema and Saline lock started in radiology Symptoms: fatigue Treatment/Intervention: None Reason test stopped: fatigue and reached target HR After recovery IV was: Discontinued via X-ray tech and No redness or edema Patient to return to Nuc. Med at : 12:15 Patient discharged: Home Patient's Condition upon discharge was: stable Comments: During test peak BP 178/60 & HR 129.  Recovery BP 158/60 & HR 78.  Symptoms resolved in recovery. Erskine Speed T

## 2011-06-14 ENCOUNTER — Ambulatory Visit: Payer: Medicare Other | Admitting: Adult Health

## 2011-06-21 ENCOUNTER — Ambulatory Visit (INDEPENDENT_AMBULATORY_CARE_PROVIDER_SITE_OTHER): Payer: Medicare Other | Admitting: Adult Health

## 2011-06-21 ENCOUNTER — Encounter: Payer: Self-pay | Admitting: Adult Health

## 2011-06-21 VITALS — BP 158/69 | HR 62 | Resp 18 | Ht 72.0 in | Wt 198.0 lb

## 2011-06-21 DIAGNOSIS — I10 Essential (primary) hypertension: Secondary | ICD-10-CM

## 2011-06-21 DIAGNOSIS — R079 Chest pain, unspecified: Secondary | ICD-10-CM

## 2011-06-21 NOTE — Assessment & Plan Note (Signed)
Review of stress test results demonstrate that there were no new areas of ischemia or reversible defects. Reassurance is given to the patient. He states that he thinks the burning in his chest was related to lack of exercise. He is now exercising daily without recurrence of pain. He is advised that if chest pain comes back, especially with minimal exertion, we will likely proceed with cardiac cath. He verbalizes understanding. He will see Dr. Dietrich Pates in one year.

## 2011-06-21 NOTE — Progress Notes (Signed)
HPI:  This is a 78 year old white male patient of Dr. Dietrich Pates who has a history of exercise-induced nonsustained V. Tach that has not been treated over the years, hypertension, diabetes mellitus, and hyperlipidemia. He also had some nonspecific chest pain over the years and had her treadmill test 2 years ago that was normal except for some exercise-induced nonsustained V. Tach.  The patient comes in today because of an episode of chest pain 3 days ago. When he laid down on his right side at night developed a chest tightness that went away within a few minutes. He was able to go to sleep but when he woke up the next morning he complained of a burning in his chest. This lasted about an hour and got better once he started moving around. He denied any radiation of the pain into his neck or shoulders he denied any dyspnea, diaphoresis, nausea, vomiting, dizziness, or pre-syncope.he said it during the night when he had the tightness he felt his arms were weak but that was the only symptom. He can walk 10 minutes on treadmill at an incline of 2.5 without any difficulty. He has not had any further chest pain since then. He says his heart was not beating irregular and he didn't have the palpitations when this occurred. He does not have a family history of coronary artery disease and is a nonsmoker.  He was seen by Wynell Balloon PA on last visit was ordered a nuclear medicine stress test. He is here for results.  Allergies  Allergen Reactions  . Diltiazem Hcl     REACTION: Bradyarrhythmia    Current Outpatient Prescriptions on File Prior to Visit  Medication Sig Dispense Refill  . amLODipine (NORVASC) 5 MG tablet Take 5 mg by mouth daily.       Marland Kitchen aspirin 81 MG EC tablet Take 81 mg by mouth daily.        . finasteride (PROSCAR) 5 MG tablet Take 5 mg by mouth daily.        Marland Kitchen losartan (COZAAR) 50 MG tablet Take 50 mg by mouth daily.      . simvastatin (ZOCOR) 40 MG tablet Take 40 mg by mouth daily.        .  Tamsulosin HCl (FLOMAX) 0.4 MG CAPS Take 0.4 mg by mouth daily.        Past Medical History  Diagnosis Date  . Syncope 1998    assoc with PSVT  . Conduction disorder of the heart     Junctional rhythm and sinus bradycardia with diltiazem   . Ventricular tachycardia     exercise induced  . Cerebrovascular disease     plaque w/o focal disease on duplex scanning 11/08  . Hypertension     Negative coronary angiography-approximately 2000  . Benign prostatic hypertrophy   . Diabetes mellitus     diet controlled     Past Surgical History  Procedure Date  . Colonoscopy     No family history on file.  History   Social History  . Marital Status: Married    Spouse Name: N/A    Number of Children: N/A  . Years of Education: N/A   Occupational History  . Not on file.   Social History Main Topics  . Smoking status: Former Smoker    Types: Cigarettes    Quit date: 04/11/1963  . Smokeless tobacco: Not on file  . Alcohol Use: Not on file  . Drug Use: Not on file  . Sexually  Active: Not on file   Other Topics Concern  . Not on file   Social History Narrative   Married, 1 child; retired; gets regular exercise.     ROS:see history of present illness otherwise negative   PHYSICAL EXAM: Well-nournished, in no acute distress. Neck: No JVD, HJR, Bruit, or thyroid enlargement Lungs: No tachypnea, clear without wheezing, rales, or rhonchi Cardiovascular: RRR, PMI not displaced, heart sounds distant, no murmurs, gallops, bruit, thrill, or heave. Abdomen: BS normal. Soft without organomegaly, masses, lesions or tenderness. Extremities: without cyanosis, clubbing or edema. Good distal pulses bilateral SKin: Warm, no lesions or rashes  Musculoskeletal: No deformities Neuro: no focal signs  BP 158/69  Pulse 62  Resp 18  Ht 6' (1.829 m)  Wt 198 lb (89.812 kg)  BMI 26.85 kg/m2   RUE:AVWUJW sinus rhythm with first degree AV block PACs no acute change

## 2011-06-21 NOTE — Patient Instructions (Signed)
**Note De-identified  Obfuscation** Your physician recommends that you continue on your current medications as directed. Please refer to the Current Medication list given to you today.  Your physician recommends that you schedule a follow-up appointment in: 1 year  

## 2011-06-21 NOTE — Assessment & Plan Note (Signed)
Blood pressure is slightly elevated today. He has not taken his antihypertensives yet today. Will not make any changes at this time.

## 2011-08-30 ENCOUNTER — Encounter (INDEPENDENT_AMBULATORY_CARE_PROVIDER_SITE_OTHER): Payer: Self-pay | Admitting: *Deleted

## 2011-09-19 ENCOUNTER — Telehealth (INDEPENDENT_AMBULATORY_CARE_PROVIDER_SITE_OTHER): Payer: Self-pay | Admitting: *Deleted

## 2011-09-19 ENCOUNTER — Encounter (INDEPENDENT_AMBULATORY_CARE_PROVIDER_SITE_OTHER): Payer: Self-pay | Admitting: Internal Medicine

## 2011-09-19 ENCOUNTER — Ambulatory Visit (INDEPENDENT_AMBULATORY_CARE_PROVIDER_SITE_OTHER): Payer: Medicare Other | Admitting: Internal Medicine

## 2011-09-19 ENCOUNTER — Other Ambulatory Visit (INDEPENDENT_AMBULATORY_CARE_PROVIDER_SITE_OTHER): Payer: Self-pay | Admitting: *Deleted

## 2011-09-19 VITALS — BP 100/58 | HR 70 | Temp 98.1°F | Ht 70.0 in | Wt 196.4 lb

## 2011-09-19 DIAGNOSIS — R1310 Dysphagia, unspecified: Secondary | ICD-10-CM

## 2011-09-19 DIAGNOSIS — Z800 Family history of malignant neoplasm of digestive organs: Secondary | ICD-10-CM | POA: Insufficient documentation

## 2011-09-19 DIAGNOSIS — Z86010 Personal history of colonic polyps: Secondary | ICD-10-CM

## 2011-09-19 DIAGNOSIS — Z1211 Encounter for screening for malignant neoplasm of colon: Secondary | ICD-10-CM

## 2011-09-19 DIAGNOSIS — D126 Benign neoplasm of colon, unspecified: Secondary | ICD-10-CM | POA: Insufficient documentation

## 2011-09-19 MED ORDER — PEG-KCL-NACL-NASULF-NA ASC-C 100 G PO SOLR: 1.0000 | Freq: Once | ORAL | Status: DC

## 2011-09-19 NOTE — Progress Notes (Signed)
Subjective:     Patient ID: Patrick [name], male   DOB: [birth year], 78 y.o.   MRN: [id number]  HPI  Patrick [name] is a 78 yr old male presenting today with c/o dysphagia. He says every now and then foods will loge in his esophagus. This usually occurs when he is eating out and eating fast. This usually occurs about once a month. If he cuts beef up it will not lodge. Appetite is good. No weight loss. No abdominal pain. No acid reflux.    Family hx of colon cancer. Colonoscopy 08/2006:FINAL DIAGNOSIS:  1. Three small polyps ablated via cold biopsy, one from cecum, another  one from hepatic flexure located at the rim of a diverticulum, and  third polyp was at transverse colon.  2. Three small diverticula, one at the ascending colon, the second one  at hepatic flexure, and third one at sigmoid.  3. External hemorrhoids.    Review of Systems see hpi Current Outpatient Prescriptions  Medication Sig Dispense Refill  . amLODipine (NORVASC) 5 MG tablet Take 5 mg by mouth daily.       Marland Kitchen aspirin 81 MG EC tablet Take 81 mg by mouth daily.        . finasteride (PROSCAR) 5 MG tablet Take 5 mg by mouth daily.        Marland Kitchen losartan (COZAAR) 50 MG tablet Take 50 mg by mouth daily.      . simvastatin (ZOCOR) 40 MG tablet Take 40 mg by mouth daily.        . Tamsulosin HCl (FLOMAX) 0.4 MG CAPS Take 0.4 mg by mouth daily.       Past Medical History  Diagnosis Date  . Syncope 1998    assoc with PSVT  . Conduction disorder of the heart     Junctional rhythm and sinus bradycardia with diltiazem   . Ventricular tachycardia     exercise induced  . Cerebrovascular disease     plaque w/o focal disease on duplex scanning 11/08  . Hypertension     Negative coronary angiography-approximately 2000  . Benign prostatic hypertrophy   . Diabetes mellitus     diet controlled    Past Surgical History  Procedure Date  . Colonoscopy    History   Social History  . Marital Status: Married    Spouse Name: N/A    Number  of Children: N/A  . Years of Education: N/A   Occupational History  . Not on file.   Social History Main Topics  . Smoking status: Former Smoker    Types: Cigarettes    Quit date: 04/11/1963  . Smokeless tobacco: Not on file  . Alcohol Use: No  . Drug Use: No  . Sexually Active: Not on file   Other Topics Concern  . Not on file   Social History Narrative   Married, 1 child; retired; gets regular exercise.    Family Status  Relation Status Death Age  . Father Deceased 51    Alzheimers  . Mother Deceased 12    brain aneurysm   . Sister Alive     healthy         Objective:   Physical Exam     Assessment:    Solild food dysphagia. Patient would like to start PPI first to see if this get better. PR in 2 week   Plan:   Dexilant samples. PR in 2 weeks.

## 2011-09-19 NOTE — Telephone Encounter (Signed)
Patient needs movi prep 

## 2011-09-19 NOTE — Patient Instructions (Addendum)
Dexilant samples, PR in 2 weeks.

## 2011-10-06 ENCOUNTER — Encounter (HOSPITAL_COMMUNITY): Payer: Self-pay | Admitting: Pharmacy Technician

## 2011-10-13 ENCOUNTER — Encounter (HOSPITAL_COMMUNITY)
Admission: RE | Disposition: A | Discharge status: Home / Self Care | Payer: Self-pay | Source: Ambulatory Visit | Attending: Internal Medicine

## 2011-10-13 ENCOUNTER — Ambulatory Visit (HOSPITAL_COMMUNITY)
Admission: RE | Admit: 2011-10-13 | Discharge: 2011-10-13 | Disposition: A | Discharge status: Home / Self Care | Payer: Medicare Other | Source: Ambulatory Visit | Attending: Internal Medicine | Admitting: Internal Medicine

## 2011-10-13 ENCOUNTER — Encounter (HOSPITAL_COMMUNITY): Payer: Self-pay | Admitting: *Deleted

## 2011-10-13 DIAGNOSIS — K5730 Diverticulosis of large intestine without perforation or abscess without bleeding: Secondary | ICD-10-CM

## 2011-10-13 DIAGNOSIS — E119 Type 2 diabetes mellitus without complications: Secondary | ICD-10-CM | POA: Insufficient documentation

## 2011-10-13 DIAGNOSIS — Z79899 Other long term (current) drug therapy: Secondary | ICD-10-CM | POA: Insufficient documentation

## 2011-10-13 DIAGNOSIS — K644 Residual hemorrhoidal skin tags: Secondary | ICD-10-CM | POA: Insufficient documentation

## 2011-10-13 DIAGNOSIS — K449 Diaphragmatic hernia without obstruction or gangrene: Secondary | ICD-10-CM | POA: Insufficient documentation

## 2011-10-13 DIAGNOSIS — K222 Esophageal obstruction: Secondary | ICD-10-CM | POA: Insufficient documentation

## 2011-10-13 DIAGNOSIS — R1310 Dysphagia, unspecified: Secondary | ICD-10-CM | POA: Insufficient documentation

## 2011-10-13 DIAGNOSIS — Z860100 Personal history of colon polyps, unspecified: Secondary | ICD-10-CM | POA: Insufficient documentation

## 2011-10-13 DIAGNOSIS — K2980 Duodenitis without bleeding: Secondary | ICD-10-CM | POA: Insufficient documentation

## 2011-10-13 DIAGNOSIS — K2970 Gastritis, unspecified, without bleeding: Secondary | ICD-10-CM

## 2011-10-13 DIAGNOSIS — K208 Other esophagitis: Secondary | ICD-10-CM

## 2011-10-13 DIAGNOSIS — K2990 Gastroduodenitis, unspecified, without bleeding: Secondary | ICD-10-CM

## 2011-10-13 DIAGNOSIS — Z800 Family history of malignant neoplasm of digestive organs: Secondary | ICD-10-CM | POA: Insufficient documentation

## 2011-10-13 HISTORY — PX: MALONEY DILATION: SHX5535

## 2011-10-13 HISTORY — PX: BALLOON DILATION: SHX5330

## 2011-10-13 HISTORY — PX: SAVORY DILATION: SHX5439

## 2011-10-13 SURGERY — COLONOSCOPY WITH ESOPHAGOGASTRODUODENOSCOPY (EGD)
Anesthesia: Moderate Sedation

## 2011-10-13 MED ORDER — MIDAZOLAM HCL 5 MG/5ML IJ SOLN
INTRAMUSCULAR | Status: AC
  Filled 2011-10-13: qty 10

## 2011-10-13 MED ORDER — STERILE WATER FOR IRRIGATION IR SOLN
Status: DC | PRN
  Administered 2011-10-13: 13:00:00

## 2011-10-13 MED ORDER — PANTOPRAZOLE SODIUM 40 MG PO TBEC: 40.0000 mg | DELAYED_RELEASE_TABLET | Freq: Every day | ORAL | Status: DC

## 2011-10-13 MED ORDER — MEPERIDINE HCL 50 MG/ML IJ SOLN
INTRAMUSCULAR | Status: AC
  Filled 2011-10-13: qty 1

## 2011-10-13 MED ORDER — POLYETHYLENE GLYCOL 3350 17 GM/SCOOP PO POWD: 17.0000 g | Freq: Every day | ORAL | Status: AC

## 2011-10-13 MED ORDER — MEPERIDINE HCL 50 MG/ML IJ SOLN
INTRAMUSCULAR | Status: DC | PRN
  Administered 2011-10-13: 25 mg via INTRAVENOUS

## 2011-10-13 MED ORDER — BUTAMBEN-TETRACAINE-BENZOCAINE 2-2-14 % EX AERO
INHALATION_SPRAY | CUTANEOUS | Status: DC | PRN
  Administered 2011-10-13: 2 via TOPICAL

## 2011-10-13 MED ORDER — MIDAZOLAM HCL 5 MG/5ML IJ SOLN
INTRAMUSCULAR | Status: DC | PRN
  Administered 2011-10-13 (×2): 2 mg via INTRAVENOUS
  Administered 2011-10-13: 1 mg via INTRAVENOUS

## 2011-10-13 MED ORDER — SODIUM CHLORIDE 0.45 % IV SOLN
Freq: Once | INTRAVENOUS | Status: AC
  Administered 2011-10-13: 13:00:00 via INTRAVENOUS

## 2011-10-13 NOTE — Op Note (Signed)
EGD ED AND COLONOSCOPY PROCEDURE REPORT  PATIENT:  Patrick [name]  MR#:  [id number] Birthdate:  [birth year], 78 y.o., male Endoscopist:  Dr. Malissa Hippo, MD Referred By:  Dr. Carylon Perches, MD Procedure Date: 10/13/2011  Procedure:   EGD, ED & Colonoscopy  Indications:  Solid food dysphagia and history of colonic polyps and family history of colon carcinoma(mother).            Informed Consent:  The risks, benefits, alternatives & imponderables which include, but are not limited to, bleeding, infection, perforation, drug reaction and potential missed lesion have been reviewed.  The potential for biopsy, lesion removal, esophageal dilation, etc. have also been discussed.  Questions have been answered.  All parties agreeable.  Please see history & physical in medical record for more information.  Medications:  Demerol 25 mg IV Versed 5 mg IV Cetacaine spray topically for oropharyngeal anesthesia  EGD  Description of procedure:  The endoscope was introduced through the mouth and advanced to the second portion of the duodenum without difficulty or limitations. The mucosal surfaces were surveyed very carefully during advancement of the scope and upon withdrawal.  Findings:  Esophagus:  Mucosa of the esophagus was normal. Stricture noted at GE junction with friable mucosa. Stricture initially dilated with a scope. GEJ:  37 cm Hiatus:  39 cm Stomach:  Stomach was empty and distended very well with insufflation. Folds in the proximal stomach are normal. Examination mucosa at body, antrum, pyloric channel, angularis fundus and cardia was normal. Duodenum:  Bulbar mucosa revealed patchy edema and erythema. Post bulbar mucosa was normal.  Therapeutic/Diagnostic Maneuvers Performed:  Esophageal stricture was dilated with a balloon dilator. Balloon dilator was advanced with the scope. Guidewire was pushed into gastric lumen. Balloon was positioned across the stricture and insufflated to a diameter of  15 mm, 16.5 and finally to 18 mm. Os dilation I was able to pass the scope across this segment without any resistance.  COLONOSCOPY Description of procedure:  After a digital rectal exam was performed, that colonoscope was advanced from the anus through the rectum and in region of hepatic flexure. Patient had lot of thick liquid and pieces of formed stool. Even getting to this segment was difficult. As the scope was withdrawn mucosal surfaces were carefully surveyed utilizing scope tip to flexion to facilitate fold flattening as needed. The scope was pulled down into the rectum where a thorough exam including retroflexion was performed.  Findings:   Poor prep resulting in exam to hepatic flexure only. Few scattered diverticula at transverse colon. Small hemorrhoids proximal to dentate line.  Therapeutic/Diagnostic Maneuvers Performed:  None  Complications:  None  Cecal Withdrawal Time:  N/A minutes  Impression:  High-grade stricture at GE junction with changes of esophagitis. Stricture was dilated with a balloon to 18 mm. Small sliding hiatal hernia. Bulbar duodenitis. Colonoscopy incomplete to hepatic flexure secondary to poor prep. Few diverticula at transverse colon and internal hemorrhoids.    Recommendations:  Anti-reflux measures. Pantoprazole 40 mg by mouth every morning. Polyethylene glycol 17 g by mouth daily. H. pylori serology.  Yara Tomkinson U  10/13/2011 1:56 PM  CC: Dr. Carylon Perches, MD & Dr. Bonnetta Barry ref. provider found

## 2011-10-13 NOTE — H&P (Signed)
Patrick [name] is an 78 y.o. male.   Chief Complaint: Patient is here for esophagogastroduodenoscopy possible esophageal dilation and colonoscopy. HPI: Patient is an 78 year old Caucasian male who presents  With 5-6 year history of intermittent solid food dysphagia. He's had multiple episodes of food impaction relieved either spontaneously or with regurgitation. He has most difficulty with beef. He denies frequent heartburn. He has good appetite. He has lost a few pounds recently but intention. He denies melena or rectal bleeding. He has history of colonic polyps. His last colonoscopy was over 5 years ago and had 3 adenomas removed. Had adenomas removed on prior colonoscopy as well. Family history significant for colon carcinoma in his mother who possibly was in early or late 62s.  Past Medical History  Diagnosis Date  . Syncope 1998    assoc with PSVT  . Conduction disorder of the heart     Junctional rhythm and sinus bradycardia with diltiazem   . Ventricular tachycardia     exercise induced  . Cerebrovascular disease     plaque w/o focal disease on duplex scanning 11/08  . Hypertension     Negative coronary angiography-approximately 2000  . Benign prostatic hypertrophy   . Diabetes mellitus     diet controlled     Past Surgical History  Procedure Date  . Colonoscopy     History reviewed. No pertinent family history. Social History:  reports that he quit smoking about 48 years ago. His smoking use included Cigarettes. He does not have any smokeless tobacco history on file. He reports that he does not drink alcohol or use illicit drugs.  Allergies:  Allergies  Allergen Reactions  . Diltiazem Hcl     REACTION: Bradyarrhythmia    Medications Prior to Admission  Medication Sig Dispense Refill  . amLODipine (NORVASC) 5 MG tablet Take 5 mg by mouth daily.       Marland Kitchen aspirin 81 MG EC tablet Take 81 mg by mouth daily.        . finasteride (PROSCAR) 5 MG tablet Take 5 mg by mouth  daily.        Marland Kitchen losartan (COZAAR) 50 MG tablet Take 50 mg by mouth daily.      . peg 3350 powder (MOVIPREP) 100 G SOLR Take 1 kit (100 g total) by mouth once.  1 kit  0  . simvastatin (ZOCOR) 40 MG tablet Take 40 mg by mouth daily.        . Tamsulosin HCl (FLOMAX) 0.4 MG CAPS Take 0.4 mg by mouth daily.        No results found for this or any previous visit (from the past 48 hour(s)). No results found.  ROS  Blood pressure 174/74, pulse 78, temperature 98 F (36.7 C), resp. rate 16, height 5\' 10"  (1.778 m), weight 180 lb (81.647 kg), SpO2 98.00%. Physical Exam  Constitutional: He appears well-developed and well-nourished.  HENT:  Mouth/Throat: Oropharynx is clear and moist.  Eyes: Conjunctivae are normal. No scleral icterus.  Neck: No thyromegaly present.  Cardiovascular: Normal rate, regular rhythm and normal heart sounds.   No murmur heard. Respiratory: Effort normal and breath sounds normal.  GI: Soft. He exhibits no distension and no mass. There is no tenderness.  Musculoskeletal: He exhibits no edema.  Lymphadenopathy:    He has no cervical adenopathy.  Neurological: He is alert.  Skin: Skin is warm and dry.     Assessment/Plan Solid food dysphagia. History of colonic adenomas and family history of  colon carcinoma. EGD possible ED and colonoscopy.  Lakysha Kossman U 10/13/2011, 12:48 PM

## 2011-10-14 LAB — H. PYLORI ANTIBODY, IGG: H Pylori IgG: <0.4 {ISR}

## 2011-10-19 ENCOUNTER — Encounter (HOSPITAL_COMMUNITY): Payer: Self-pay | Admitting: Internal Medicine

## 2012-01-18 ENCOUNTER — Encounter (HOSPITAL_COMMUNITY): Payer: Self-pay | Admitting: Pharmacy Technician

## 2012-01-23 ENCOUNTER — Ambulatory Visit (INDEPENDENT_AMBULATORY_CARE_PROVIDER_SITE_OTHER): Payer: Medicare Other | Admitting: Internal Medicine

## 2012-01-23 ENCOUNTER — Encounter (INDEPENDENT_AMBULATORY_CARE_PROVIDER_SITE_OTHER): Payer: Self-pay | Admitting: Internal Medicine

## 2012-01-23 VITALS — BP 124/72 | HR 78 | Temp 98.6°F | Resp 20 | Ht 68.0 in | Wt 187.8 lb

## 2012-01-23 DIAGNOSIS — K5900 Constipation, unspecified: Secondary | ICD-10-CM

## 2012-01-23 DIAGNOSIS — K222 Esophageal obstruction: Secondary | ICD-10-CM

## 2012-01-23 MED ORDER — POLYETHYLENE GLYCOL 3350 17 GM/SCOOP PO POWD: 8.5000 g | ORAL | Status: DC

## 2012-01-23 NOTE — Patient Instructions (Signed)
Take polyethylene glycol 8.5 g her half a scoop 3 times a week. Can reduce dose further if needed. Colonoscopy to be scheduled around April or May next year.

## 2012-01-23 NOTE — Progress Notes (Signed)
Presenting complaint;  Followup for constipation and dysphagia.  Subjective:  Patient is 78 year old Caucasian male who underwent EGD and colonoscopy on 10/13/2011. He was found to have stricture at distal esophagus along with changes of esophagitis. Stricture was dilated to 18 mm the balloon. He was begun on a PPI. Colonoscopy was incomplete to hepatic flexure because of poor prep. He was begun on polyethylene glycol because of constipation. He states after he took few doses he developed diarrhea and stopped the medication. He feels he is ready to go back on it but at lower dose. He denies heartburn nausea vomiting abdominal pain melena or rectal bleeding but he has noted abdominal rumbling.  Current Medications: Current Outpatient Prescriptions  Medication Sig Dispense Refill  . amLODipine (NORVASC) 5 MG tablet Take 5 mg by mouth daily.       Marland Kitchen aspirin 81 MG EC tablet Take 81 mg by mouth daily.        . finasteride (PROSCAR) 5 MG tablet Take 5 mg by mouth daily.        Marland Kitchen losartan (COZAAR) 50 MG tablet Take 50 mg by mouth daily.      . pantoprazole (PROTONIX) 40 MG tablet Take 1 tablet (40 mg total) by mouth daily.  30 tablet  11  . simvastatin (ZOCOR) 20 MG tablet Take 20 mg by mouth daily.      . Tamsulosin HCl (FLOMAX) 0.4 MG CAPS Take 0.4 mg by mouth daily.      Marland Kitchen BESIVANCE 0.6 % SUSP Patient states that he will start this after upcoming eye surgery      . DUREZOL 0.05 % EMUL Patient states that he will start this medication with his upcoming eye surgery      . PROLENSA 0.07 % SOLN Patient states that he will start this medication with his upcoming eye surgery         Objective: Blood pressure 124/72, pulse 78, temperature 98.6 F (37 C), temperature source Oral, resp. rate 20, height 5\' 8"  (1.727 m), weight 187 lb 12.8 oz (85.186 kg). Patient is alert and in no acute distress. Conjunctiva is pink. Sclera is nonicteric Oropharyngeal mucosa is normal. No neck masses or thyromegaly  noted. Abdomen is symmetrical. Bowel sounds are hyperactive. Abdomen is soft and nontender without organomegaly or masses.  No LE edema or clubbing noted.   Assessment:  #1. Dysphagia has resolved since esophageal dilation. Stricture felt to be secondary to silent GERD and he should continue PPI chronically. #2. Chronic constipation. Standard dose of polyethylene glycol turned out to be too much for him. His colonoscopy was incomplete secondary to preparation options reviewed with patient which include dairy him an enema or repeat colonoscopy. Given the fact that his mother had colon carcinoma he should consider having another colonoscopy next year after which we may stop further screening exams.   Plan:  Continue pantoprazole at 40 mg by mouth every morning. Take polyethylene glycol 8.5 g 3 times a week. Can titrate dose up or down if necessary. Will plan colonoscopy in April or May next year following 2 day prep.

## 2012-01-24 NOTE — Patient Instructions (Addendum)
Your procedure is scheduled on: 02/02/2012  Report to [facility] at   615      AM.  Call this number if you have problems the morning of surgery: [phone]   Do not eat food or drink liquids :After Midnight.      Take these medicines the morning of surgery with A SIP OF WATER:protonix,norvasc,proscar,cozaar, flomax  Do not wear jewelry, make-up or nail polish.  Do not wear lotions, powders, or perfumes. You may wear deodorant.  Do not shave 48 hours prior to surgery.  Do not bring valuables to the hospital.  Contacts, dentures or bridgework may not be worn into surgery.  Leave suitcase in the car. After surgery it may be brought to your room.  For patients admitted to the hospital, checkout time is 11:00 AM the day of discharge.   Patients discharged the day of surgery will not be allowed to drive home.  :     Please read over the following fact sheets that you were given: Coughing and Deep Breathing, Surgical Site Infection Prevention, Anesthesia Post-op Instructions and Care and Recovery After Surgery    Cataract A cataract is a clouding of the lens of the eye. When a lens becomes cloudy, vision is reduced based on the degree and nature of the clouding. Many cataracts reduce vision to some degree. Some cataracts make people more near-sighted as they develop. Other cataracts increase glare. Cataracts that are ignored and become worse can sometimes look white. The white color can be seen through the pupil. CAUSES   Aging. However, cataracts may occur at any age, even in newborns.   Certain drugs.   Trauma to the eye.   Certain diseases such as diabetes.   Specific eye diseases such as chronic inflammation inside the eye or a sudden attack of a rare form of glaucoma.   Inherited or acquired medical problems.  SYMPTOMS   Gradual, progressive drop in vision in the affected eye.   Severe, rapid visual loss. This most often happens when trauma is the cause.  DIAGNOSIS  To detect  a cataract, an eye doctor examines the lens. Cataracts are best diagnosed with an exam of the eyes with the pupils enlarged (dilated) by drops.  TREATMENT  For an early cataract, vision may improve by using different eyeglasses or stronger lighting. If that does not help your vision, surgery is the only effective treatment. A cataract needs to be surgically removed when vision loss interferes with your everyday activities, such as driving, reading, or watching TV. A cataract may also have to be removed if it prevents examination or treatment of another eye problem. Surgery removes the cloudy lens and usually replaces it with a substitute lens (intraocular lens, IOL).  At a time when both you and your doctor agree, the cataract will be surgically removed. If you have cataracts in both eyes, only one is usually removed at a time. This allows the operated eye to heal and be out of danger from any possible problems after surgery (such as infection or poor wound healing). In rare cases, a cataract may be doing damage to your eye. In these cases, your caregiver may advise surgical removal right away. The vast majority of people who have cataract surgery have better vision afterward. HOME CARE INSTRUCTIONS  If you are not planning surgery, you may be asked to do the following:  Use different eyeglasses.   Use stronger or brighter lighting.   Ask your eye doctor about reducing  your medicine dose or changing medicines if it is thought that a medicine caused your cataract. Changing medicines does not make the cataract go away on its own.   Become familiar with your surroundings. Poor vision can lead to injury. Avoid bumping into things on the affected side. You are at a higher risk for tripping or falling.   Exercise extreme care when driving or operating machinery.   Wear sunglasses if you are sensitive to bright light or experiencing problems with glare.  SEEK IMMEDIATE MEDICAL CARE IF:   You have a  worsening or sudden vision loss.   You notice redness, swelling, or increasing pain in the eye.   You have a fever.  Document Released: 03/14/2005 Document Revised: 03/03/2011 Document Reviewed: 11/05/2010 [facility] Patient Information 2012 [place], [place].PATIENT INSTRUCTIONS POST-ANESTHESIA  IMMEDIATELY FOLLOWING SURGERY:  Do not drive or operate machinery for the first twenty four hours after surgery.  Do not make any important decisions for twenty four hours after surgery or while taking narcotic pain medications or sedatives.  If you develop intractable nausea and vomiting or a severe headache please notify your doctor immediately.  FOLLOW-UP:  Please make an appointment with your surgeon as instructed. You do not need to follow up with anesthesia unless specifically instructed to do so.  WOUND CARE INSTRUCTIONS (if applicable):  Keep a dry clean dressing on the anesthesia/puncture wound site if there is drainage.  Once the wound has quit draining you may leave it open to air.  Generally you should leave the bandage intact for twenty four hours unless there is drainage.  If the epidural site drains for more than 36-48 hours please call the anesthesia department.  QUESTIONS?:  Please feel free to call your physician or the hospital operator if you have any questions, and they will be happy to assist you.

## 2012-01-25 ENCOUNTER — Encounter (HOSPITAL_COMMUNITY)
Admission: RE | Admit: 2012-01-25 | Discharge: 2012-01-25 | Disposition: A | Discharge status: Home / Self Care | Payer: Medicare Other | Source: Ambulatory Visit | Attending: Ophthalmology | Admitting: Ophthalmology

## 2012-01-25 ENCOUNTER — Encounter (HOSPITAL_COMMUNITY): Payer: Self-pay

## 2012-01-25 LAB — BASIC METABOLIC PANEL
CO2: 29 mEq/L (ref 19–32)
Calcium: 9.6 mg/dL (ref 8.4–10.5)
GFR calc non Af Amer: 65 mL/min — ABNORMAL LOW (ref >90)
Glucose, Bld: 159 mg/dL — ABNORMAL HIGH (ref 70–99)
Potassium: 4.3 mEq/L (ref 3.5–5.1)
Sodium: 136 mEq/L (ref 135–145)

## 2012-01-25 LAB — HEMOGLOBIN AND HEMATOCRIT, BLOOD
HCT: 35.5 % — ABNORMAL LOW (ref 39.0–52.0)
Hemoglobin: 12.1 g/dL — ABNORMAL LOW (ref 13.0–17.0)

## 2012-02-01 MED ORDER — PHENYLEPHRINE HCL 2.5 % OP SOLN
OPHTHALMIC | Status: AC
  Filled 2012-02-01: qty 2

## 2012-02-01 MED ORDER — CYCLOPENTOLATE-PHENYLEPHRINE 0.2-1 % OP SOLN
OPHTHALMIC | Status: AC
  Filled 2012-02-01: qty 2

## 2012-02-01 MED ORDER — LIDOCAINE HCL 3.5 % OP GEL
OPHTHALMIC | Status: AC
  Filled 2012-02-01: qty 5

## 2012-02-01 MED ORDER — NEOMYCIN-POLYMYXIN-DEXAMETH 3.5-10000-0.1 OP OINT
TOPICAL_OINTMENT | OPHTHALMIC | Status: AC
  Filled 2012-02-01: qty 3.5

## 2012-02-01 MED ORDER — TETRACAINE HCL 0.5 % OP SOLN
OPHTHALMIC | Status: AC
  Filled 2012-02-01: qty 2

## 2012-02-01 MED ORDER — LIDOCAINE HCL (PF) 1 % IJ SOLN
INTRAMUSCULAR | Status: AC
  Filled 2012-02-01: qty 2

## 2012-02-02 ENCOUNTER — Ambulatory Visit (HOSPITAL_COMMUNITY)
Admission: RE | Admit: 2012-02-02 | Discharge: 2012-02-02 | Disposition: A | Discharge status: Home / Self Care | Payer: Medicare Other | Source: Ambulatory Visit | Attending: Ophthalmology | Admitting: Ophthalmology

## 2012-02-02 ENCOUNTER — Encounter (HOSPITAL_COMMUNITY): Payer: Self-pay | Admitting: Anesthesiology

## 2012-02-02 ENCOUNTER — Encounter (HOSPITAL_COMMUNITY): Payer: Self-pay | Admitting: *Deleted

## 2012-02-02 ENCOUNTER — Encounter (HOSPITAL_COMMUNITY)
Admission: RE | Disposition: A | Discharge status: Home / Self Care | Payer: Self-pay | Source: Ambulatory Visit | Attending: Ophthalmology

## 2012-02-02 ENCOUNTER — Ambulatory Visit (HOSPITAL_COMMUNITY): Payer: Medicare Other | Admitting: Anesthesiology

## 2012-02-02 DIAGNOSIS — Z01812 Encounter for preprocedural laboratory examination: Secondary | ICD-10-CM | POA: Insufficient documentation

## 2012-02-02 DIAGNOSIS — H2510 Age-related nuclear cataract, unspecified eye: Secondary | ICD-10-CM | POA: Insufficient documentation

## 2012-02-02 DIAGNOSIS — H2181 Floppy iris syndrome: Secondary | ICD-10-CM | POA: Insufficient documentation

## 2012-02-02 DIAGNOSIS — I10 Essential (primary) hypertension: Secondary | ICD-10-CM | POA: Insufficient documentation

## 2012-02-02 DIAGNOSIS — E119 Type 2 diabetes mellitus without complications: Secondary | ICD-10-CM | POA: Insufficient documentation

## 2012-02-02 HISTORY — PX: CATARACT EXTRACTION W/PHACO: SHX586

## 2012-02-02 SURGERY — PHACOEMULSIFICATION, CATARACT, WITH IOL INSERTION
Anesthesia: Monitor Anesthesia Care | Site: Eye | Laterality: Right | Wound class: Clean

## 2012-02-02 MED ORDER — LIDOCAINE HCL (PF) 1 % IJ SOLN
INTRAOCULAR | Status: DC | PRN
  Administered 2012-02-02: 08:00:00 via OPHTHALMIC

## 2012-02-02 MED ORDER — LIDOCAINE HCL 3.5 % OP GEL
1.0000 "application " | Freq: Once | OPHTHALMIC | Status: AC
  Administered 2012-02-02: 1 via OPHTHALMIC

## 2012-02-02 MED ORDER — CYCLOPENTOLATE-PHENYLEPHRINE 0.2-1 % OP SOLN
1.0000 [drp] | OPHTHALMIC | Status: AC
  Administered 2012-02-02 (×3): 1 [drp] via OPHTHALMIC

## 2012-02-02 MED ORDER — ONDANSETRON HCL 4 MG/2ML IJ SOLN: 4.0000 mg | Freq: Once | INTRAMUSCULAR | Status: DC | PRN

## 2012-02-02 MED ORDER — TETRACAINE HCL 0.5 % OP SOLN
1.0000 [drp] | OPHTHALMIC | Status: AC
  Administered 2012-02-02 (×3): 1 [drp] via OPHTHALMIC

## 2012-02-02 MED ORDER — LIDOCAINE 3.5 % OP GEL OPTIME - NO CHARGE
OPHTHALMIC | Status: DC | PRN
  Administered 2012-02-02: 1 [drp] via OPHTHALMIC

## 2012-02-02 MED ORDER — PROVISC 10 MG/ML IO SOLN
INTRAOCULAR | Status: DC | PRN
  Administered 2012-02-02: 8.5 mg via INTRAOCULAR

## 2012-02-02 MED ORDER — EPINEPHRINE HCL 1 MG/ML IJ SOLN
INTRAMUSCULAR | Status: AC
  Filled 2012-02-02: qty 1

## 2012-02-02 MED ORDER — BSS IO SOLN
INTRAOCULAR | Status: DC | PRN
  Administered 2012-02-02: 15 mL via INTRAOCULAR

## 2012-02-02 MED ORDER — POVIDONE-IODINE 5 % OP SOLN
OPHTHALMIC | Status: DC | PRN
  Administered 2012-02-02: 1 via OPHTHALMIC

## 2012-02-02 MED ORDER — LACTATED RINGERS IV SOLN
INTRAVENOUS | Status: DC
  Administered 2012-02-02: 1000 mL via INTRAVENOUS

## 2012-02-02 MED ORDER — EPINEPHRINE HCL 1 MG/ML IJ SOLN
INTRAOCULAR | Status: DC | PRN
  Administered 2012-02-02: 08:00:00

## 2012-02-02 MED ORDER — LIDOCAINE HCL (PF) 1 % IJ SOLN: INTRAMUSCULAR | Status: DC | PRN

## 2012-02-02 MED ORDER — MIDAZOLAM HCL 2 MG/2ML IJ SOLN
1.0000 mg | INTRAMUSCULAR | Status: DC | PRN
  Administered 2012-02-02: 2 mg via INTRAVENOUS

## 2012-02-02 MED ORDER — MIDAZOLAM HCL 2 MG/2ML IJ SOLN
INTRAMUSCULAR | Status: AC
  Filled 2012-02-02: qty 2

## 2012-02-02 MED ORDER — PHENYLEPHRINE HCL 2.5 % OP SOLN
1.0000 [drp] | OPHTHALMIC | Status: AC
  Administered 2012-02-02 (×3): 1 [drp] via OPHTHALMIC

## 2012-02-02 MED ORDER — NEOMYCIN-POLYMYXIN-DEXAMETH 0.1 % OP OINT
TOPICAL_OINTMENT | OPHTHALMIC | Status: DC | PRN
  Administered 2012-02-02: 1 via OPHTHALMIC

## 2012-02-02 MED ORDER — FENTANYL CITRATE 0.05 MG/ML IJ SOLN: 25.0000 ug | INTRAMUSCULAR | Status: DC | PRN

## 2012-02-02 MED ORDER — CYCLOPENTOLATE HCL 1 % OP SOLN: 1.0000 [drp] | OPHTHALMIC | Status: AC

## 2012-02-02 SURGICAL SUPPLY — 32 items

## 2012-02-02 NOTE — Transfer of Care (Signed)
Immediate Anesthesia Transfer of Care Note  Patient: Patrick [name] [facility]  Procedure(s) Performed: Procedure(s) (LRB) with comments: CATARACT EXTRACTION PHACO AND INTRAOCULAR LENS PLACEMENT (IOC) (Right) - CDE:31.31  Patient Location: PACU and Short Stay  Anesthesia Type:MAC  Level of Consciousness: awake, alert , oriented and patient cooperative  Airway & Oxygen Therapy: Patient Spontanous Breathing  Post-op Assessment: Report given to PACU RN, Post -op Vital signs reviewed and stable and Patient moving all extremities  Post vital signs: Reviewed and stable  Complications: No apparent anesthesia complications

## 2012-02-02 NOTE — Brief Op Note (Signed)
Pre-Op Dx: Cataract OD Post-Op Dx: Cataract OD Surgeon: Karia Ehresman Anesthesia: Topical with MAC Surgery: Cataract Extraction with Intraocular lens Implant OD Implant: B&L enVista Specimen: None Complications: None 

## 2012-02-02 NOTE — H&P (Signed)
I have reviewed the H&P, the patient was re-examined, and I have identified no interval changes in medical condition and plan of care since the history and physical of record  

## 2012-02-02 NOTE — Anesthesia Preprocedure Evaluation (Signed)
Anesthesia Evaluation  Patient identified by MRN, date of birth, ID band Patient awake    Reviewed: Allergy & Precautions, H&P , NPO status , Patient's Chart, lab work & pertinent test results  Airway Mallampati: II      Dental  (+) Teeth Intact   Pulmonary neg pulmonary ROS, former smoker,  breath sounds clear to auscultation        Cardiovascular hypertension, Pt. on medications + dysrhythmias Supra Ventricular Tachycardia Rhythm:Regular Rate:Normal     Neuro/Psych    GI/Hepatic negative GI ROS,   Endo/Other  diabetes, Type 2  Renal/GU      Musculoskeletal   Abdominal   Peds  Hematology   Anesthesia Other Findings   Reproductive/Obstetrics                           Anesthesia Physical Anesthesia Plan  ASA: III  Anesthesia Plan: MAC   Post-op Pain Management:    Induction: Intravenous  Airway Management Planned: Nasal Cannula  Additional Equipment:   Intra-op Plan:   Post-operative Plan:   Informed Consent: I have reviewed the patients History and Physical, chart, labs and discussed the procedure including the risks, benefits and alternatives for the proposed anesthesia with the patient or authorized representative who has indicated his/her understanding and acceptance.     Plan Discussed with:   Anesthesia Plan Comments:         Anesthesia Quick Evaluation

## 2012-02-02 NOTE — Anesthesia Postprocedure Evaluation (Signed)
  Anesthesia Post-op Note  Patient: Patrick [name] [facility]  Procedure(s) Performed: Procedure(s) (LRB) with comments: CATARACT EXTRACTION PHACO AND INTRAOCULAR LENS PLACEMENT (IOC) (Right) - CDE:31.31  Patient Location: PACU and Short Stay  Anesthesia Type:MAC  Level of Consciousness: awake, alert , oriented and patient cooperative  Airway and Oxygen Therapy: Patient Spontanous Breathing  Post-op Pain: none  Post-op Assessment: Post-op Vital signs reviewed, Patient's Cardiovascular Status Stable, Respiratory Function Stable, Patent Airway and Pain level controlled  Post-op Vital Signs: Reviewed and stable  Complications: No apparent anesthesia complications

## 2012-02-03 ENCOUNTER — Encounter (HOSPITAL_COMMUNITY): Payer: Self-pay | Admitting: Ophthalmology

## 2012-02-03 NOTE — Op Note (Signed)
NAME:  SYLER, NORCIA NO.:  [id number]  MEDICAL RECORD NO.:  [id number]  LOCATION:  [facility]                          FACILITY:  [facility]  PHYSICIAN:  Susanne Greenhouse, MD       DATE OF BIRTH:  1928/11/08  DATE OF PROCEDURE:  02/02/2012 DATE OF DISCHARGE:  02/02/2012                              OPERATIVE REPORT   PREOPERATIVE DIAGNOSIS:  Nuclear cataract, right eye, diagnosis code 366.16.  POSTOPERATIVE DIAGNOSES:  Nuclear cataract, right eye, diagnosis code 366.16.  Intraoperative floppy iris syndrome, diagnosis code 364.81.  SURGEON:  Susanne Greenhouse, MD  OPERATION PERFORMED:  Phacoemulsification with posterior chamber intraocular lens implantation, right eye.  ANESTHESIA:  Topical with monitored anesthesia care.  OPERATIVE SUMMARY:  In the preoperative area, dilating drops were placed into the right eye.  The patient was then brought into the operating room where he was placed under topical anesthesia and IV sedation.  The eye was then prepped and draped.  Beginning with a 75 blade, a paracentesis port was made at the surgeon's 2 o'clock position.  The anterior chamber was then filled with a 1% nonpreserved lidocaine solution with epinephrine.  This was followed by Viscoat to deepen the chamber.  A small fornix-based peritomy was performed superiorly.  Next, a single iris hook was placed through the limbus superiorly.  A 2.4-mm keratome blade was then used to make a clear corneal incision over the iris hook.  A bent cystotome needle and Utrata forceps were used to create a continuous tear capsulotomy.  Hydrodissection was performed using balanced salt solution on a fine cannula.  The lens nucleus was then removed using phacoemulsification in a quadrant cracking technique. The cortical material was then removed with irrigation and aspiration. The capsular bag and anterior chamber were refilled with Provisc.  The wound was widened to approximately 3 mm and a posterior  chamber intraocular lens was placed into the capsular bag without difficulty using an [employer] lens injecting system.  A single 10-0 nylon suture was then used to close the incision as well as stromal hydration. The Provisc was removed from the anterior chamber and capsular bag with irrigation and aspiration.  At this point, the wounds were tested for leak, which were negative.  The anterior chamber remained deep and stable.  The patient tolerated the procedure well.  There were no operative complications, and he awoke from topical anesthesia and IV sedation without problem.  No surgical specimens.  Prosthetic device used is a Theme park manager, model EnVista, model number MX60, power of 19.5, serial number is [id number].          ______________________________ Susanne Greenhouse, MD     KEH/MEDQ  D:  02/02/2012  T:  02/03/2012  Job:  [id number]

## 2012-02-07 LAB — GLUCOSE, CAPILLARY: Glucose-Capillary: 115 mg/dL — ABNORMAL HIGH (ref 70–99)

## 2012-02-09 ENCOUNTER — Encounter (HOSPITAL_COMMUNITY): Payer: Self-pay

## 2012-02-15 ENCOUNTER — Encounter (HOSPITAL_COMMUNITY): Payer: Self-pay

## 2012-02-15 ENCOUNTER — Encounter (HOSPITAL_COMMUNITY)
Admission: RE | Admit: 2012-02-15 | Discharge: 2012-02-15 | Payer: Medicare Other | Source: Ambulatory Visit | Admitting: Ophthalmology

## 2012-02-16 ENCOUNTER — Encounter (HOSPITAL_COMMUNITY): Payer: Self-pay | Admitting: Pharmacy Technician

## 2012-02-17 MED ORDER — NEOMYCIN-POLYMYXIN-DEXAMETH 3.5-10000-0.1 OP OINT
TOPICAL_OINTMENT | OPHTHALMIC | Status: AC
  Filled 2012-02-17: qty 3.5

## 2012-02-17 MED ORDER — LIDOCAINE HCL 3.5 % OP GEL
OPHTHALMIC | Status: AC
  Filled 2012-02-17: qty 5

## 2012-02-17 MED ORDER — PHENYLEPHRINE HCL 2.5 % OP SOLN
OPHTHALMIC | Status: AC
  Filled 2012-02-17: qty 2

## 2012-02-17 MED ORDER — TETRACAINE HCL 0.5 % OP SOLN
OPHTHALMIC | Status: AC
  Filled 2012-02-17: qty 2

## 2012-02-17 MED ORDER — CYCLOPENTOLATE-PHENYLEPHRINE 0.2-1 % OP SOLN
OPHTHALMIC | Status: AC
  Filled 2012-02-17: qty 2

## 2012-02-17 MED ORDER — LIDOCAINE HCL (PF) 1 % IJ SOLN
INTRAMUSCULAR | Status: AC
  Filled 2012-02-17: qty 2

## 2012-02-20 ENCOUNTER — Encounter (HOSPITAL_COMMUNITY): Payer: Self-pay | Admitting: Anesthesiology

## 2012-02-20 ENCOUNTER — Ambulatory Visit (HOSPITAL_COMMUNITY): Payer: Medicare Other | Admitting: Anesthesiology

## 2012-02-20 ENCOUNTER — Encounter (HOSPITAL_COMMUNITY): Payer: Self-pay | Admitting: *Deleted

## 2012-02-20 ENCOUNTER — Ambulatory Visit (HOSPITAL_COMMUNITY)
Admission: RE | Admit: 2012-02-20 | Discharge: 2012-02-20 | Disposition: A | Discharge status: Home / Self Care | Payer: Medicare Other | Source: Ambulatory Visit | Attending: Ophthalmology | Admitting: Ophthalmology

## 2012-02-20 ENCOUNTER — Encounter (HOSPITAL_COMMUNITY)
Admission: RE | Disposition: A | Discharge status: Home / Self Care | Payer: Self-pay | Source: Ambulatory Visit | Attending: Ophthalmology

## 2012-02-20 DIAGNOSIS — I10 Essential (primary) hypertension: Secondary | ICD-10-CM | POA: Insufficient documentation

## 2012-02-20 DIAGNOSIS — H2181 Floppy iris syndrome: Secondary | ICD-10-CM | POA: Insufficient documentation

## 2012-02-20 DIAGNOSIS — H2510 Age-related nuclear cataract, unspecified eye: Secondary | ICD-10-CM | POA: Insufficient documentation

## 2012-02-20 DIAGNOSIS — E119 Type 2 diabetes mellitus without complications: Secondary | ICD-10-CM | POA: Insufficient documentation

## 2012-02-20 HISTORY — PX: CATARACT EXTRACTION W/PHACO: SHX586

## 2012-02-20 LAB — GLUCOSE, CAPILLARY: Glucose-Capillary: 104 mg/dL — ABNORMAL HIGH (ref 70–99)

## 2012-02-20 SURGERY — PHACOEMULSIFICATION, CATARACT, WITH IOL INSERTION
Anesthesia: Monitor Anesthesia Care | Site: Eye | Laterality: Left | Wound class: Clean

## 2012-02-20 MED ORDER — EPINEPHRINE HCL 1 MG/ML IJ SOLN
INTRAOCULAR | Status: DC | PRN
  Administered 2012-02-20: 14:00:00

## 2012-02-20 MED ORDER — PROVISC 10 MG/ML IO SOLN
INTRAOCULAR | Status: DC | PRN
  Administered 2012-02-20: 8.5 mg via INTRAOCULAR

## 2012-02-20 MED ORDER — EPINEPHRINE HCL 1 MG/ML IJ SOLN
INTRAMUSCULAR | Status: AC
  Filled 2012-02-20: qty 1

## 2012-02-20 MED ORDER — BSS IO SOLN
INTRAOCULAR | Status: DC | PRN
  Administered 2012-02-20: 15 mL via INTRAOCULAR

## 2012-02-20 MED ORDER — NEOMYCIN-POLYMYXIN-DEXAMETH 0.1 % OP OINT
TOPICAL_OINTMENT | OPHTHALMIC | Status: DC | PRN
  Administered 2012-02-20: 1 via OPHTHALMIC

## 2012-02-20 MED ORDER — LACTATED RINGERS IV SOLN
INTRAVENOUS | Status: DC
  Administered 2012-02-20: 13:00:00 via INTRAVENOUS

## 2012-02-20 MED ORDER — LIDOCAINE HCL (PF) 1 % IJ SOLN
INTRAOCULAR | Status: DC | PRN
  Administered 2012-02-20: 14:00:00 via OPHTHALMIC

## 2012-02-20 MED ORDER — MIDAZOLAM HCL 2 MG/2ML IJ SOLN
INTRAMUSCULAR | Status: AC
  Filled 2012-02-20: qty 2

## 2012-02-20 MED ORDER — PHENYLEPHRINE HCL 2.5 % OP SOLN
1.0000 [drp] | OPHTHALMIC | Status: AC
  Administered 2012-02-20 (×3): 1 [drp] via OPHTHALMIC

## 2012-02-20 MED ORDER — LIDOCAINE HCL 3.5 % OP GEL
1.0000 "application " | Freq: Once | OPHTHALMIC | Status: AC
  Administered 2012-02-20: 1 via OPHTHALMIC

## 2012-02-20 MED ORDER — CYCLOPENTOLATE-PHENYLEPHRINE 0.2-1 % OP SOLN
1.0000 [drp] | OPHTHALMIC | Status: AC
  Administered 2012-02-20 (×3): 1 [drp] via OPHTHALMIC

## 2012-02-20 MED ORDER — LIDOCAINE 3.5 % OP GEL OPTIME - NO CHARGE
OPHTHALMIC | Status: DC | PRN
  Administered 2012-02-20: 2 [drp] via OPHTHALMIC

## 2012-02-20 MED ORDER — POVIDONE-IODINE 5 % OP SOLN
OPHTHALMIC | Status: DC | PRN
  Administered 2012-02-20: 1 via OPHTHALMIC

## 2012-02-20 MED ORDER — MIDAZOLAM HCL 2 MG/2ML IJ SOLN
1.0000 mg | INTRAMUSCULAR | Status: DC | PRN
  Administered 2012-02-20: 2 mg via INTRAVENOUS
  Administered 2012-02-20: 1 mg via INTRAVENOUS

## 2012-02-20 MED ORDER — TETRACAINE HCL 0.5 % OP SOLN
1.0000 [drp] | OPHTHALMIC | Status: AC
  Administered 2012-02-20 (×3): 1 [drp] via OPHTHALMIC

## 2012-02-20 SURGICAL SUPPLY — 32 items

## 2012-02-20 NOTE — Anesthesia Preprocedure Evaluation (Signed)
Anesthesia Evaluation  Patient identified by MRN, date of birth, ID band Patient awake    Reviewed: Allergy & Precautions, H&P , NPO status , Patient's Chart, lab work & pertinent test results  Airway Mallampati: II      Dental  (+) Teeth Intact   Pulmonary neg pulmonary ROS, former smoker,  breath sounds clear to auscultation        Cardiovascular hypertension, Pt. on medications + dysrhythmias Supra Ventricular Tachycardia Rhythm:Regular Rate:Normal     Neuro/Psych    GI/Hepatic negative GI ROS,   Endo/Other  diabetes, Type 2  Renal/GU      Musculoskeletal   Abdominal   Peds  Hematology   Anesthesia Other Findings   Reproductive/Obstetrics                           Anesthesia Physical Anesthesia Plan  ASA: III  Anesthesia Plan: MAC   Post-op Pain Management:    Induction: Intravenous  Airway Management Planned: Nasal Cannula  Additional Equipment:   Intra-op Plan:   Post-operative Plan:   Informed Consent: I have reviewed the patients History and Physical, chart, labs and discussed the procedure including the risks, benefits and alternatives for the proposed anesthesia with the patient or authorized representative who has indicated his/her understanding and acceptance.     Plan Discussed with:   Anesthesia Plan Comments:         Anesthesia Quick Evaluation  

## 2012-02-20 NOTE — H&P (Signed)
I have reviewed the H&P, the patient was re-examined, and I have identified no interval changes in medical condition and plan of care since the history and physical of record  

## 2012-02-20 NOTE — Anesthesia Postprocedure Evaluation (Signed)
  Anesthesia Post-op Note  Patient: Patrick [name], Patrick [name]  Procedure(s) Performed: Procedure(s) (LRB) with comments: CATARACT EXTRACTION PHACO AND INTRAOCULAR LENS PLACEMENT (IOC) (Left) - CDE:  27.97  Patient Location: PACU  Anesthesia Type:MAC  Level of Consciousness: awake, alert  and oriented  Airway and Oxygen Therapy: Patient Spontanous Breathing  Post-op Pain: none  Post-op Assessment: Post-op Vital signs reviewed, Patient's Cardiovascular Status Stable, Respiratory Function Stable and Patent Airway  Post-op Vital Signs: Reviewed and stable  Complications: No apparent anesthesia complications

## 2012-02-20 NOTE — Preoperative (Signed)
Beta Blockers   Reason not to administer Beta Blockers:Not Applicable 

## 2012-02-20 NOTE — Brief Op Note (Signed)
Pre-Op Dx: Cataract OS Post-Op Dx: Cataract OS Surgeon: Twinkle Sockwell Anesthesia: Topical with MAC Surgery: Cataract Extraction with Intraocular lens Implant OS Implant: B&L enVista Specimen: None Complications: None 

## 2012-02-20 NOTE — Transfer of Care (Signed)
Immediate Anesthesia Transfer of Care Note  Patient: Patrick [name] [facility]  Procedure(s) Performed: Procedure(s) (LRB) with comments: CATARACT EXTRACTION PHACO AND INTRAOCULAR LENS PLACEMENT (IOC) (Left) - CDE:  27.97  Patient Location: PACU and Short Stay  Anesthesia Type:MAC  Level of Consciousness: awake  Airway & Oxygen Therapy: Patient Spontanous Breathing  Post-op Assessment: Report given to PACU RN  Post vital signs: Reviewed  Complications: No apparent anesthesia complications

## 2012-02-21 NOTE — Op Note (Signed)
NAME:  Patrick [name], Patrick [name] NO.:  [id number]  MEDICAL RECORD NO.:  [id number]  LOCATION:  [facility]                          FACILITY:  [facility]  PHYSICIAN:  Susanne Greenhouse, MD       DATE OF BIRTH:  11-20-1928  DATE OF PROCEDURE:  02/20/2012 DATE OF DISCHARGE:  02/20/2012                              OPERATIVE REPORT   PREOPERATIVE DIAGNOSIS:  Nuclear cataract, left eye, diagnosis code 366.16.  POSTOPERATIVE DIAGNOSES:  Nuclear cataract, left eye, diagnosis code 366.16; intraoperative floppy iris syndrome, diagnosis code 364.81.  SURGEON:  Susanne Greenhouse, MD  OPERATION PERFORMED:  Phacoemulsification with posterior chamber intraocular lens implantation, left eye.  ANESTHESIA:  Topical with monitored anesthesia care and IV sedation.  OPERATIVE SUMMARY:  In the preoperative area, dilating drops were placed into the left eye.  The patient was then brought into the operating room where he was placed under topical anesthesia and IV sedation.  The eye was then prepped and draped.  Beginning with a 75 blade, a paracentesis port was made at the surgeon's 2 o'clock position.  The anterior chamber was then filled with a 1% nonpreserved lidocaine solution with epinephrine.  This was followed by Viscoat to deepen the chamber.  A small fornix-based peritomy was performed superiorly.  Next, a single iris hook was placed through the limbus superiorly.  A 2.4-mm keratome blade was then used to make a clear corneal incision over the iris hook. A bent cystotome needle and Utrata forceps were used to create a continuous tear capsulotomy.  Hydrodissection was performed using balanced salt solution on a fine cannula.  The lens nucleus was then removed using phacoemulsification in a quadrant cracking technique.  The cortical material was then removed with irrigation and aspiration.  The capsular bag and anterior chamber were refilled with Provisc.  The wound was widened to approximately 3 mm and  a posterior chamber intraocular lens was placed into the capsular bag without difficulty using an [employer] lens injecting system.  A single 10-0 nylon suture was then used to close the incision as well as stromal hydration.  The Provisc was removed from the anterior chamber and capsular bag with irrigation and aspiration.  At this point, the wounds were tested for leak, which were negative.  The anterior chamber remained deep and stable.  The patient tolerated the procedure well.  There were no operative complications, and he awoke from topical anesthesia and IV sedation without problem.  No surgical specimens.  Prosthetic device used is a Theme park manager, model EnVista, model number MX60, power of 20.5, serial number [id number].          ______________________________ Susanne Greenhouse, MD     KEH/MEDQ  D:  02/20/2012  T:  02/21/2012  Job:  [id number]

## 2012-02-21 NOTE — Addendum Note (Signed)
Addendum  created 02/21/12 1002 by Moshe Salisbury, CRNA   Modules edited:Charges VN

## 2012-02-21 NOTE — Addendum Note (Signed)
Addendum  created 02/21/12 1002 by Brya Simerly E Nicola Quesnell, CRNA   Modules edited:Charges VN    

## 2012-02-22 ENCOUNTER — Encounter (HOSPITAL_COMMUNITY): Payer: Self-pay | Admitting: Ophthalmology

## 2012-06-25 ENCOUNTER — Telehealth (INDEPENDENT_AMBULATORY_CARE_PROVIDER_SITE_OTHER): Payer: Self-pay | Admitting: *Deleted

## 2012-06-25 NOTE — Telephone Encounter (Signed)
Mr Fritchman is on my recall list for repeat TCS in April -- his last TCS was 09/2011, I don't an indication for him to repeat it this early -- please advise as to when or if he will need a repeat TCS -- thanks

## 2012-07-01 NOTE — Telephone Encounter (Signed)
He needs ov sometime in May or June, 2014.

## 2012-07-02 ENCOUNTER — Encounter (INDEPENDENT_AMBULATORY_CARE_PROVIDER_SITE_OTHER): Payer: Self-pay | Admitting: *Deleted

## 2012-07-02 NOTE — Telephone Encounter (Signed)
OV 08/28/12, letter mailed to patient

## 2012-08-28 ENCOUNTER — Ambulatory Visit (INDEPENDENT_AMBULATORY_CARE_PROVIDER_SITE_OTHER): Payer: Medicare Other | Admitting: Internal Medicine

## 2012-08-28 ENCOUNTER — Encounter (INDEPENDENT_AMBULATORY_CARE_PROVIDER_SITE_OTHER): Payer: Self-pay | Admitting: Internal Medicine

## 2012-08-28 VITALS — BP 118/68 | HR 70 | Temp 97.5°F | Resp 18 | Ht 70.0 in | Wt 185.1 lb

## 2012-08-28 DIAGNOSIS — Z86010 Personal history of colonic polyps: Secondary | ICD-10-CM

## 2012-08-28 DIAGNOSIS — K219 Gastro-esophageal reflux disease without esophagitis: Secondary | ICD-10-CM

## 2012-08-28 DIAGNOSIS — K5900 Constipation, unspecified: Secondary | ICD-10-CM

## 2012-08-28 NOTE — Progress Notes (Signed)
Presenting complaint;  Followup for chronic GERD history of colonic polyps and constipation.  Subjective:  Patient is 79 year old Caucasian male who presents for scheduled visit. He has history of colonic polyps and family history of colon carcinoma in his mother. His last colonoscopy was in July 2013 and was incomplete to hepatic flexure secondary to poor prep. He was found to have few scattered diverticula. He also had EGD with ED because of dysphagia. He was found to have high-grade stricture at GE junction and a small sliding heart hernia. Stricture was dilated to 18 mm and he has been maintained on PPI. He denies heartburn. He is able to swallow without any difficulty. He has good appetite. He has lost 11 pounds in one year. He states he is trying to lose weight. He generally has 1-2 bowel movements per day. He has to strain some. He is taking MiraLax for more than twice a week. He denies abdominal pain or rectal bleeding. He is not interested in colonoscopy or other tests to examine his lower GI tract at this time.  Current Medications: Current Outpatient Prescriptions  Medication Sig Dispense Refill  . amLODipine (NORVASC) 5 MG tablet Take 5 mg by mouth daily.       Marland Kitchen aspirin 81 MG EC tablet Take 81 mg by mouth daily.        . clobetasol cream (TEMOVATE) 0.05 % Apply 1 application topically as needed.       . finasteride (PROSCAR) 5 MG tablet Take 5 mg by mouth daily.        Marland Kitchen losartan (COZAAR) 50 MG tablet Take 50 mg by mouth daily.      . pantoprazole (PROTONIX) 40 MG tablet Take 1 tablet (40 mg total) by mouth daily.  30 tablet  11  . polyethylene glycol (MIRALAX / GLYCOLAX) packet Take 17 g by mouth daily.      . simvastatin (ZOCOR) 20 MG tablet Take 20 mg by mouth daily.      . Tamsulosin HCl (FLOMAX) 0.4 MG CAPS Take 0.4 mg by mouth daily.       No current facility-administered medications for this visit.     Objective: Blood pressure 118/68, pulse 70, temperature 97.5 F  (36.4 C), temperature source Oral, resp. rate 18, height 5\' 10"  (1.778 m), weight 185 lb 1.6 oz (83.961 kg). Patient is alert and in no acute distress. Conjunctiva is pink. Sclera is nonicteric Oropharyngeal mucosa is normal. No neck masses or thyromegaly noted. Cardiac exam with regular rhythm normal S1 and S2. No murmur or gallop noted. Lungs are clear to auscultation. Abdomen soft and nontender without organomegaly or masses. Rectal examination reveals formed stool which is guaiac negative.  No LE edema or clubbing noted.   Assessment:  #1. Chronic constipation. He needs to be taking MiraLax more often in order to have 2-3 bowel movements per week. #2. History of colonic polyps. Last colonoscopy was incomplete to hepatic flexure in July 2013 secondary to poor prep. She not interested in repeat examination at this time. #3. Chronic GERD complicated by distal esophageal stricture which is dilated in July 2013. He is doing well with therapy. #4.   Plan:  continue pantoprazole at current dose. High fiber diet. Take MiraLax half to one scoop daily. Office visit in one year.

## 2012-08-28 NOTE — Patient Instructions (Signed)
High fiber diet. Please make sure you have at least 2 or maybe 3 bowel movements per week.

## 2012-10-08 ENCOUNTER — Other Ambulatory Visit (INDEPENDENT_AMBULATORY_CARE_PROVIDER_SITE_OTHER): Payer: Self-pay | Admitting: Internal Medicine

## 2013-05-27 ENCOUNTER — Other Ambulatory Visit (HOSPITAL_COMMUNITY): Payer: Self-pay | Admitting: Internal Medicine

## 2013-05-27 DIAGNOSIS — N644 Mastodynia: Secondary | ICD-10-CM

## 2013-05-29 ENCOUNTER — Ambulatory Visit (HOSPITAL_COMMUNITY)
Admission: RE | Admit: 2013-05-29 | Discharge: 2013-05-29 | Disposition: A | Discharge status: Home / Self Care | Payer: Medicare Other | Source: Ambulatory Visit | Attending: Internal Medicine | Admitting: Internal Medicine

## 2013-05-29 DIAGNOSIS — N62 Hypertrophy of breast: Secondary | ICD-10-CM | POA: Insufficient documentation

## 2013-05-29 DIAGNOSIS — N644 Mastodynia: Secondary | ICD-10-CM | POA: Insufficient documentation

## 2013-06-19 ENCOUNTER — Encounter (INDEPENDENT_AMBULATORY_CARE_PROVIDER_SITE_OTHER): Payer: Self-pay | Admitting: *Deleted

## 2013-06-25 ENCOUNTER — Other Ambulatory Visit (HOSPITAL_COMMUNITY): Payer: Self-pay | Admitting: Internal Medicine

## 2013-06-25 DIAGNOSIS — R413 Other amnesia: Secondary | ICD-10-CM

## 2013-07-01 ENCOUNTER — Ambulatory Visit (HOSPITAL_COMMUNITY)
Admission: RE | Admit: 2013-07-01 | Discharge: 2013-07-01 | Disposition: A | Discharge status: Home / Self Care | Payer: Medicare Other | Source: Ambulatory Visit | Attending: Internal Medicine | Admitting: Internal Medicine

## 2013-07-01 DIAGNOSIS — R413 Other amnesia: Secondary | ICD-10-CM

## 2013-08-28 ENCOUNTER — Ambulatory Visit (INDEPENDENT_AMBULATORY_CARE_PROVIDER_SITE_OTHER): Payer: Medicare Other | Admitting: Internal Medicine

## 2013-10-09 ENCOUNTER — Other Ambulatory Visit (INDEPENDENT_AMBULATORY_CARE_PROVIDER_SITE_OTHER): Payer: Self-pay | Admitting: Internal Medicine

## 2013-10-09 DIAGNOSIS — K219 Gastro-esophageal reflux disease without esophagitis: Secondary | ICD-10-CM

## 2013-10-09 NOTE — Telephone Encounter (Signed)
Needs office visit.

## 2014-04-28 ENCOUNTER — Other Ambulatory Visit (INDEPENDENT_AMBULATORY_CARE_PROVIDER_SITE_OTHER): Payer: Self-pay | Admitting: Internal Medicine

## 2014-09-08 ENCOUNTER — Other Ambulatory Visit (INDEPENDENT_AMBULATORY_CARE_PROVIDER_SITE_OTHER): Payer: Self-pay | Admitting: Internal Medicine

## 2014-12-16 ENCOUNTER — Other Ambulatory Visit (INDEPENDENT_AMBULATORY_CARE_PROVIDER_SITE_OTHER): Payer: Self-pay | Admitting: Internal Medicine

## 2015-05-06 DIAGNOSIS — H43813 Vitreous degeneration, bilateral: Secondary | ICD-10-CM | POA: Diagnosis not present

## 2015-05-15 DIAGNOSIS — E119 Type 2 diabetes mellitus without complications: Secondary | ICD-10-CM | POA: Diagnosis not present

## 2015-05-22 DIAGNOSIS — R413 Other amnesia: Secondary | ICD-10-CM | POA: Diagnosis not present

## 2015-05-22 DIAGNOSIS — Z6827 Body mass index (BMI) 27.0-27.9, adult: Secondary | ICD-10-CM | POA: Diagnosis not present

## 2015-05-22 DIAGNOSIS — E119 Type 2 diabetes mellitus without complications: Secondary | ICD-10-CM | POA: Diagnosis not present

## 2015-05-22 DIAGNOSIS — D692 Other nonthrombocytopenic purpura: Secondary | ICD-10-CM | POA: Diagnosis not present

## 2015-08-06 ENCOUNTER — Other Ambulatory Visit (INDEPENDENT_AMBULATORY_CARE_PROVIDER_SITE_OTHER): Payer: Self-pay | Admitting: Internal Medicine

## 2015-09-03 ENCOUNTER — Other Ambulatory Visit (INDEPENDENT_AMBULATORY_CARE_PROVIDER_SITE_OTHER): Payer: Self-pay | Admitting: Internal Medicine

## 2015-10-03 IMAGING — CT CT HEAD W/O CM
1 series · 15 of 30 positions shown, 19 images · non-contrast
Comparison: CT HEAD W/O CM dated 06/13/2008

CLINICAL DATA: Memory loss, no known injury

EXAM:
CT HEAD WITHOUT CONTRAST
TECHNIQUE: Contiguous axial images were obtained from the base of the skull
through the vertex without intravenous contrast.

[Series 2: headseq 4.8 h37s · axial · 0.46mm/px · z∈[+97,+260]mm · 15 of 36 slices shown, 19 images]
[im 2/36  brain]
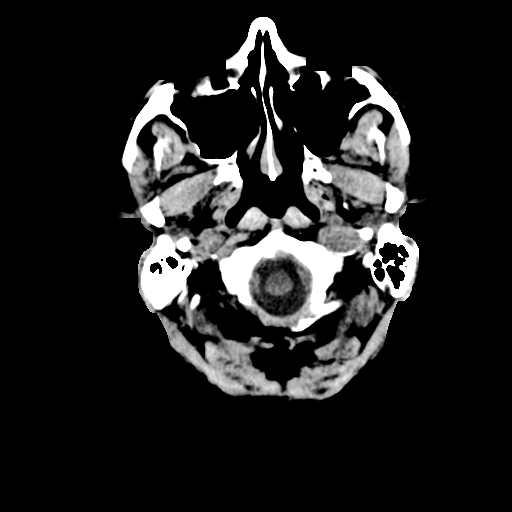
[im 2/36  bone]
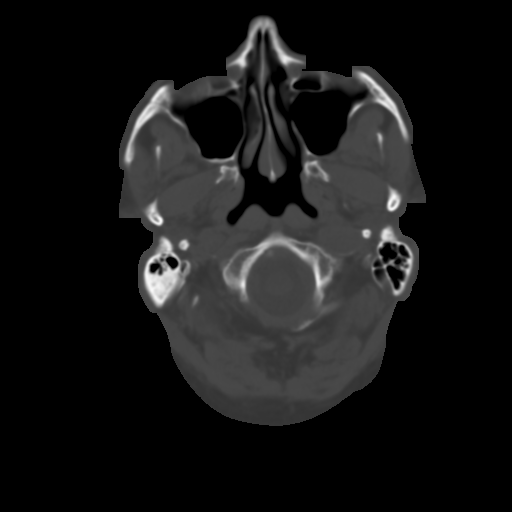
[im 4/36  brain]
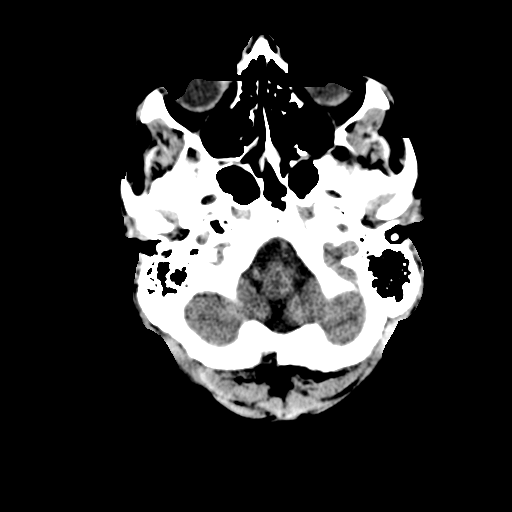
[im 7/36  brain]
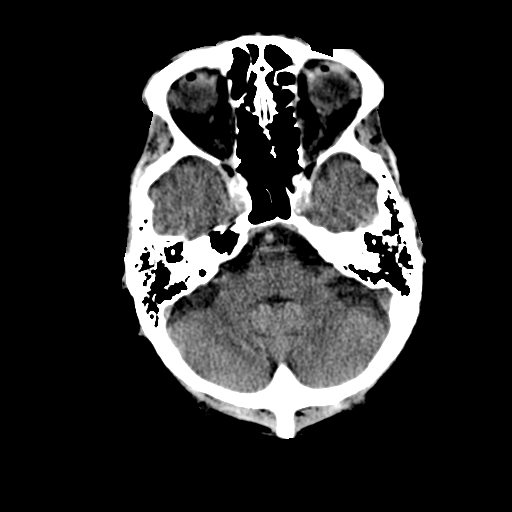
[im 9/36  brain]
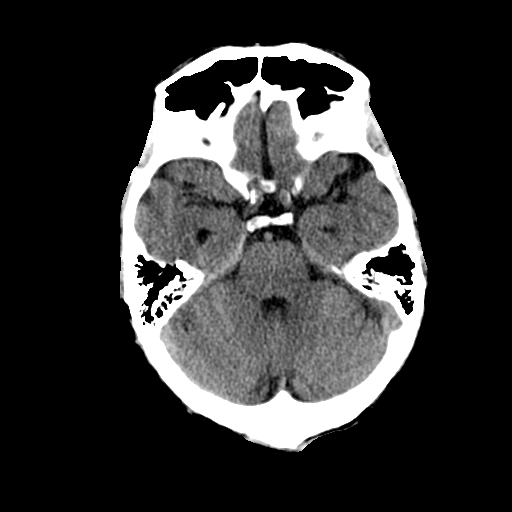
[im 11/36  brain]
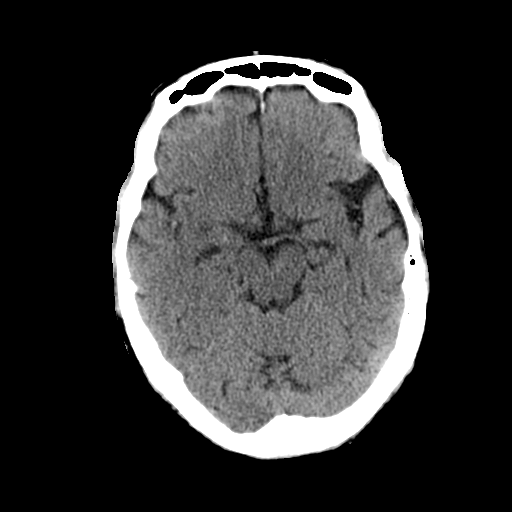
[im 11/36  bone]
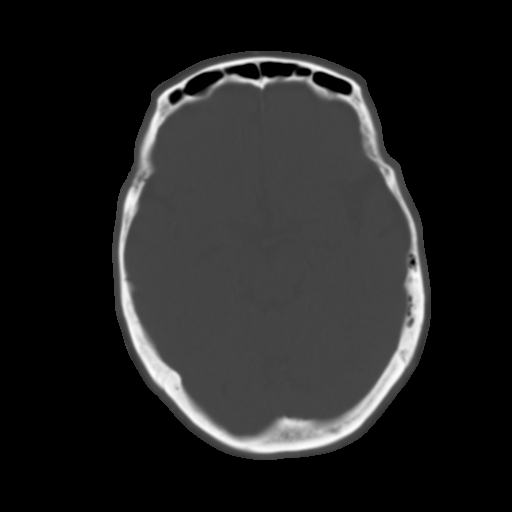
[im 14/36  brain]
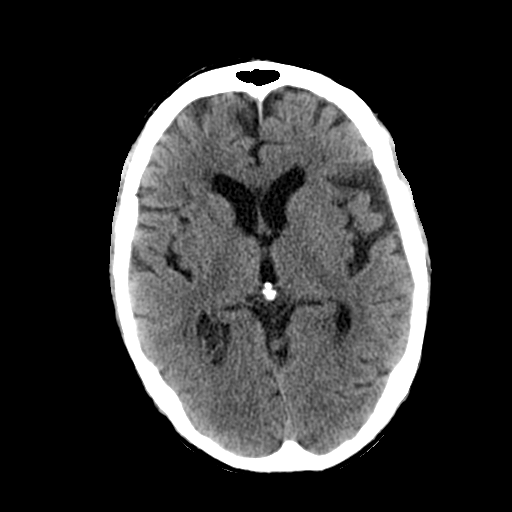
[im 16/36  brain]
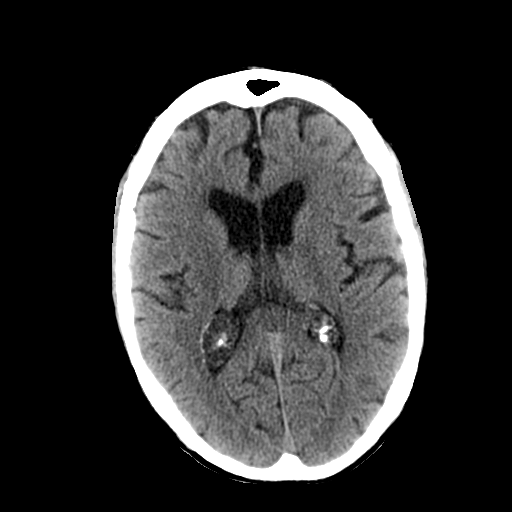
[im 19/36  brain]
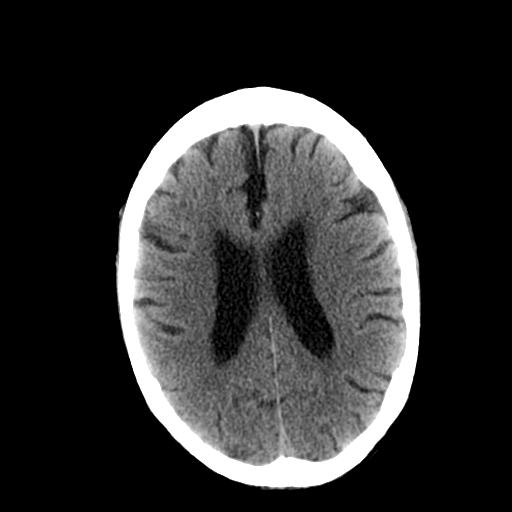
[im 20/36  brain]
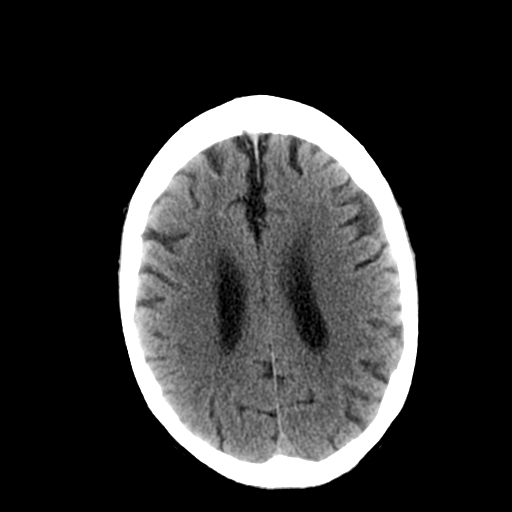
[im 20/36  bone]
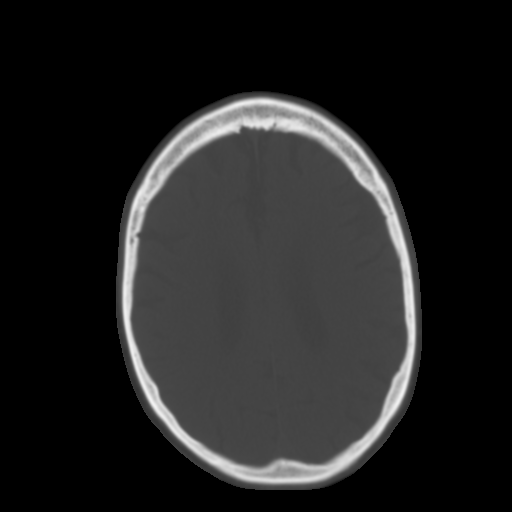
[im 22/36  brain]
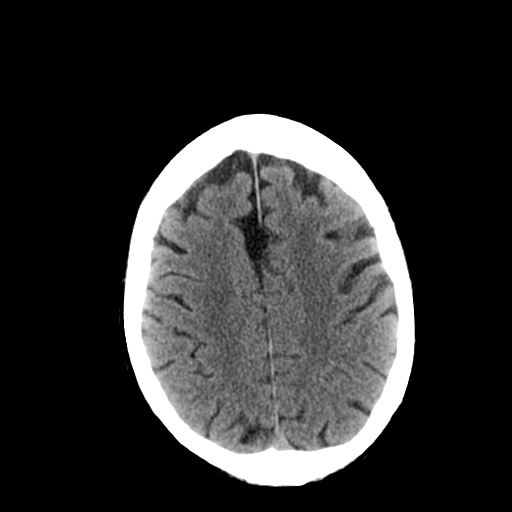
[im 25/36  brain]
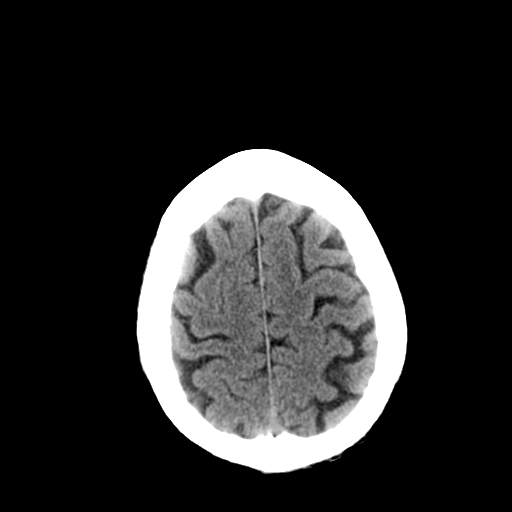
[im 27/36  brain]
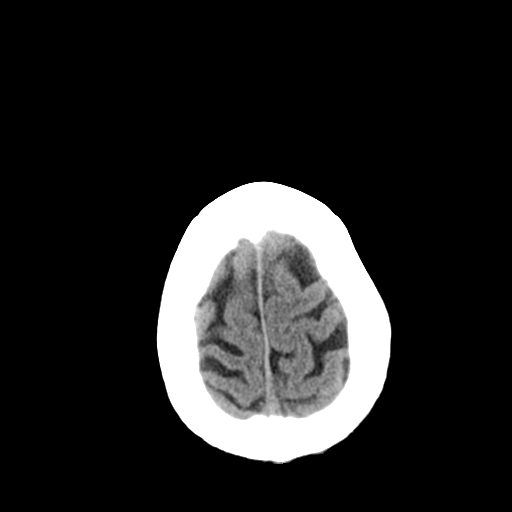
[im 29/36  brain]
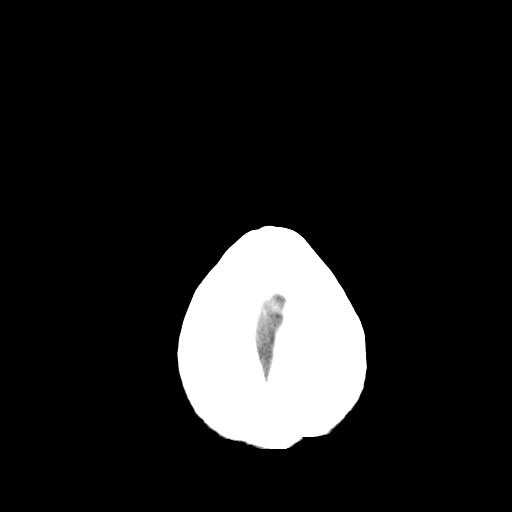
[im 29/36  bone]
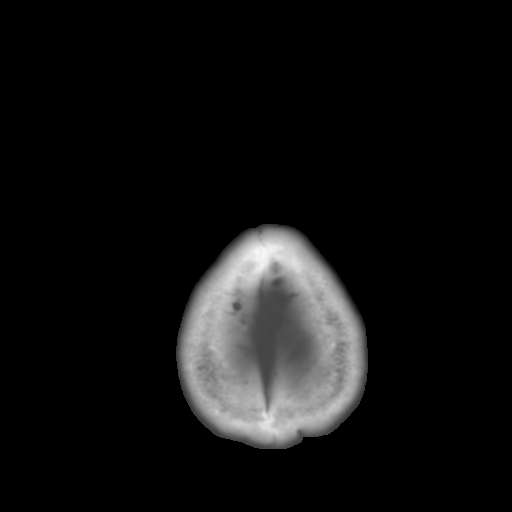
[im 32/36  brain]
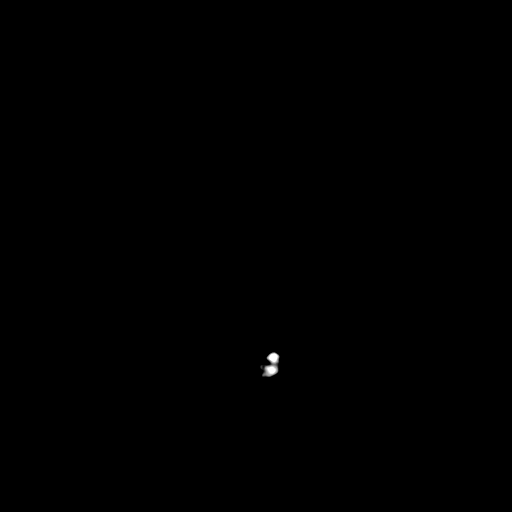
[im 34/36  brain]
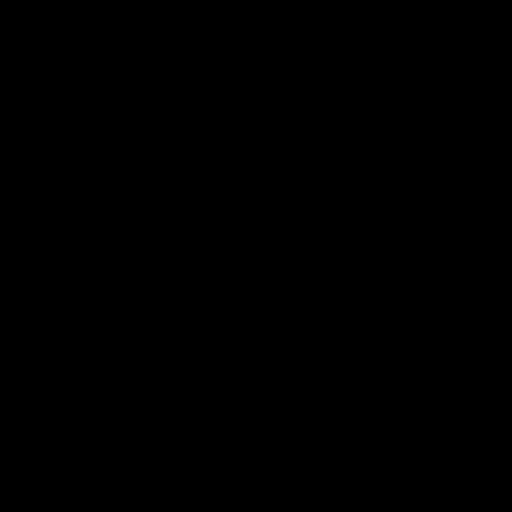

[15 of 30 positions shown; findings below may reference images not displayed]

FINDINGS: There is no evidence of mass effect, midline shift, or extra-axial
fluid collections. There is no evidence of a space-occupying lesion
or intracranial hemorrhage. There is no evidence of a cortical-based
area of acute infarction. There is generalized cerebral atrophy.
There is mild periventricular white matter low attenuation likely
secondary to microangiopathy.

The ventricles and sulci are appropriate for the patient's age. The
basal cisterns are patent.

Visualized portions of the orbits are unremarkable. The visualized
portions of the paranasal sinuses and mastoid air cells are
unremarkable. There is soft tissue density material within the
external auditory canals bilaterally likely representing ear wax.

The osseous structures are unremarkable.
IMPRESSION: 1. No acute intracranial pathology.
2. Senescent changes as described above.

## 2015-10-09 ENCOUNTER — Other Ambulatory Visit (INDEPENDENT_AMBULATORY_CARE_PROVIDER_SITE_OTHER): Payer: Self-pay | Admitting: Internal Medicine

## 2015-10-12 DIAGNOSIS — E119 Type 2 diabetes mellitus without complications: Secondary | ICD-10-CM | POA: Diagnosis not present

## 2015-10-20 DIAGNOSIS — I10 Essential (primary) hypertension: Secondary | ICD-10-CM | POA: Diagnosis not present

## 2015-10-20 DIAGNOSIS — R413 Other amnesia: Secondary | ICD-10-CM | POA: Diagnosis not present

## 2015-10-20 DIAGNOSIS — E119 Type 2 diabetes mellitus without complications: Secondary | ICD-10-CM | POA: Diagnosis not present

## 2016-02-22 DIAGNOSIS — E119 Type 2 diabetes mellitus without complications: Secondary | ICD-10-CM | POA: Diagnosis not present

## 2016-02-22 DIAGNOSIS — E785 Hyperlipidemia, unspecified: Secondary | ICD-10-CM | POA: Diagnosis not present

## 2016-02-22 DIAGNOSIS — I10 Essential (primary) hypertension: Secondary | ICD-10-CM | POA: Diagnosis not present

## 2016-02-22 DIAGNOSIS — Z79899 Other long term (current) drug therapy: Secondary | ICD-10-CM | POA: Diagnosis not present

## 2016-03-01 DIAGNOSIS — E785 Hyperlipidemia, unspecified: Secondary | ICD-10-CM | POA: Diagnosis not present

## 2016-03-01 DIAGNOSIS — G309 Alzheimer's disease, unspecified: Secondary | ICD-10-CM | POA: Diagnosis not present

## 2016-03-01 DIAGNOSIS — E119 Type 2 diabetes mellitus without complications: Secondary | ICD-10-CM | POA: Diagnosis not present

## 2016-03-01 DIAGNOSIS — Z23 Encounter for immunization: Secondary | ICD-10-CM | POA: Diagnosis not present

## 2016-03-01 DIAGNOSIS — I10 Essential (primary) hypertension: Secondary | ICD-10-CM | POA: Diagnosis not present

## 2016-04-29 ENCOUNTER — Encounter: Payer: Self-pay | Admitting: Internal Medicine

## 2016-05-19 DIAGNOSIS — M2041 Other hammer toe(s) (acquired), right foot: Secondary | ICD-10-CM | POA: Diagnosis not present

## 2016-05-19 DIAGNOSIS — L851 Acquired keratosis [keratoderma] palmaris et plantaris: Secondary | ICD-10-CM | POA: Diagnosis not present

## 2016-05-19 DIAGNOSIS — M79673 Pain in unspecified foot: Secondary | ICD-10-CM | POA: Diagnosis not present

## 2016-05-23 ENCOUNTER — Ambulatory Visit (INDEPENDENT_AMBULATORY_CARE_PROVIDER_SITE_OTHER): Payer: PPO | Admitting: Otolaryngology

## 2016-05-23 DIAGNOSIS — H900 Conductive hearing loss, bilateral: Secondary | ICD-10-CM | POA: Diagnosis not present

## 2016-05-23 DIAGNOSIS — H6123 Impacted cerumen, bilateral: Secondary | ICD-10-CM | POA: Diagnosis not present

## 2016-05-28 ENCOUNTER — Other Ambulatory Visit (INDEPENDENT_AMBULATORY_CARE_PROVIDER_SITE_OTHER): Payer: Self-pay | Admitting: Internal Medicine

## 2016-06-13 ENCOUNTER — Ambulatory Visit (INDEPENDENT_AMBULATORY_CARE_PROVIDER_SITE_OTHER): Payer: PPO | Admitting: Otolaryngology

## 2016-06-13 DIAGNOSIS — H6123 Impacted cerumen, bilateral: Secondary | ICD-10-CM

## 2016-06-28 DIAGNOSIS — E119 Type 2 diabetes mellitus without complications: Secondary | ICD-10-CM | POA: Diagnosis not present

## 2016-07-04 DIAGNOSIS — E785 Hyperlipidemia, unspecified: Secondary | ICD-10-CM | POA: Diagnosis not present

## 2016-07-04 DIAGNOSIS — I10 Essential (primary) hypertension: Secondary | ICD-10-CM | POA: Diagnosis not present

## 2016-07-04 DIAGNOSIS — E119 Type 2 diabetes mellitus without complications: Secondary | ICD-10-CM | POA: Diagnosis not present

## 2016-07-04 DIAGNOSIS — Z6827 Body mass index (BMI) 27.0-27.9, adult: Secondary | ICD-10-CM | POA: Diagnosis not present

## 2016-10-17 DIAGNOSIS — L851 Acquired keratosis [keratoderma] palmaris et plantaris: Secondary | ICD-10-CM | POA: Diagnosis not present

## 2016-10-17 DIAGNOSIS — M79674 Pain in right toe(s): Secondary | ICD-10-CM | POA: Diagnosis not present

## 2016-11-07 DIAGNOSIS — G309 Alzheimer's disease, unspecified: Secondary | ICD-10-CM | POA: Diagnosis not present

## 2016-11-07 DIAGNOSIS — E119 Type 2 diabetes mellitus without complications: Secondary | ICD-10-CM | POA: Diagnosis not present

## 2016-11-07 DIAGNOSIS — I10 Essential (primary) hypertension: Secondary | ICD-10-CM | POA: Diagnosis not present

## 2016-11-26 ENCOUNTER — Other Ambulatory Visit (INDEPENDENT_AMBULATORY_CARE_PROVIDER_SITE_OTHER): Payer: Self-pay | Admitting: Internal Medicine

## 2016-11-30 ENCOUNTER — Encounter (INDEPENDENT_AMBULATORY_CARE_PROVIDER_SITE_OTHER): Payer: Self-pay | Admitting: Internal Medicine

## 2016-11-30 NOTE — Telephone Encounter (Signed)
An appointment was given for 12/30/16 at 9:30am with Deberah Castle, NP.  A letter was mailed to the patient.

## 2016-12-13 DIAGNOSIS — Z23 Encounter for immunization: Secondary | ICD-10-CM | POA: Diagnosis not present

## 2016-12-13 DIAGNOSIS — N401 Enlarged prostate with lower urinary tract symptoms: Secondary | ICD-10-CM | POA: Diagnosis not present

## 2016-12-13 DIAGNOSIS — K219 Gastro-esophageal reflux disease without esophagitis: Secondary | ICD-10-CM | POA: Diagnosis not present

## 2016-12-30 ENCOUNTER — Ambulatory Visit (INDEPENDENT_AMBULATORY_CARE_PROVIDER_SITE_OTHER): Payer: PPO | Admitting: Internal Medicine

## 2017-01-12 DIAGNOSIS — N401 Enlarged prostate with lower urinary tract symptoms: Secondary | ICD-10-CM | POA: Diagnosis not present

## 2017-01-23 DIAGNOSIS — M79676 Pain in unspecified toe(s): Secondary | ICD-10-CM | POA: Diagnosis not present

## 2017-01-23 DIAGNOSIS — M205X9 Other deformities of toe(s) (acquired), unspecified foot: Secondary | ICD-10-CM | POA: Diagnosis not present

## 2017-01-23 DIAGNOSIS — L859 Epidermal thickening, unspecified: Secondary | ICD-10-CM | POA: Diagnosis not present

## 2017-03-13 DIAGNOSIS — M2041 Other hammer toe(s) (acquired), right foot: Secondary | ICD-10-CM | POA: Diagnosis not present

## 2017-03-13 DIAGNOSIS — M205X9 Other deformities of toe(s) (acquired), unspecified foot: Secondary | ICD-10-CM | POA: Diagnosis not present

## 2017-03-13 DIAGNOSIS — M79676 Pain in unspecified toe(s): Secondary | ICD-10-CM | POA: Diagnosis not present

## 2017-03-13 DIAGNOSIS — L859 Epidermal thickening, unspecified: Secondary | ICD-10-CM | POA: Diagnosis not present

## 2017-03-30 ENCOUNTER — Other Ambulatory Visit: Payer: Self-pay | Admitting: Podiatry

## 2017-04-12 NOTE — Patient Instructions (Signed)
[facility]  04/12/2017     @[email or web address] @   Your procedure is scheduled on  04/19/2017 .  Report to [facility] at  640   A.M.  Call this number if you have problems the morning of surgery:  [phone]   Remember:  Do not eat food or drink liquids after midnight.  Take these medicines the morning of surgery with A SIP OF WATER  Norvasc, proscar, losartan, protonix, flomax.   Do not wear jewelry, make-up or nail polish.  Do not wear lotions, powders, or perfumes, or deodorant.  Do not shave 48 hours prior to surgery.  Men may shave face and neck.  Do not bring valuables to the hospital.  [facility] is not responsible for any belongings or valuables.  Contacts, dentures or bridgework may not be worn into surgery.  Leave your suitcase in the car.  After surgery it may be brought to your room.  For patients admitted to the hospital, discharge time will be determined by your treatment team.  Patients discharged the day of surgery will not be allowed to drive home.   Name and phone number of your driver:   family Special instructions:  None  Please read over the following fact sheets that you were given. Anesthesia Post-op Instructions and Care and Recovery After Surgery      Toe Deformity Repair Toe deformity repair is a surgical procedure to reposition a toe that stays bent in an unnatural position. It is done so that the toe no longer causes pain or interferes with walking. This surgery may be done if other treatments have not helped. Tell a health care provider about:  Any allergies you have.  All medicines you are taking, including vitamins, herbs, eye drops, creams, and over-the-counter medicines.  Any problems you or family members have had with anesthetic medicines.  Any blood disorders you have.  Any surgeries you have had.  Any medical conditions you have. What are the risks? Generally, this is a safe procedure. However, problems  may occur, including:  Bleeding.  Swelling.  Infection.  Pain.  Numbness.  Scarring.  Nerve injury.  Poor toe alignment.  What happens before the procedure?  Ask your health care provider about: ? Changing or stopping your regular medicines. This is especially important if you are taking diabetes medicines or blood thinners. ? Taking medicines such as aspirin and ibuprofen. These medicines can thin your blood. Do not take these medicines before your procedure if your health care provider instructs you not to.  Follow your health care provider's instructions about eating or drinking restrictions.  Plan to have someone take you home after the procedure. What happens during the procedure?  An IV will be started in one of your veins.  You will be given one of the following: ? A medicine that numbs the area (local anesthetic). The medicine will be injected directly into your toe. If you are given a local anesthetic, you may also be given a medicine that makes you relax (sedative). ? A medicine that makes you fall asleep (general anesthetic).  One or more incisions will be made on your affected toe.  If extra bone or tissue is causing the deformity, it will be removed.  If connective tissues such as tendons or ligaments are causing the deformity, they will be removed or relocated.  Surgical pins or screws will be placed to hold your toe in place during the  healing process.  After the procedure, the incisions will be closed with stitches (sutures).  A bandage (dressing) will be placed over the incision area. The procedure may vary among health care providers and hospitals. What happens after the procedure?  You will stay in a recovery area until the anesthesia wears off.  Your blood pressure, heart rate, breathing rate, and blood oxygen level will be monitored regularly.  It is normal to have some pain. You will be given pain medicine as needed. This information is not  intended to replace advice given to you by your health care provider. Make sure you discuss any questions you have with your health care provider. Document Released: 03/11/2000 Document Revised: 11/16/2015 Document Reviewed: 11/27/2013 Elsevier Interactive Patient Education  2018 [place]. Toe Deformity Repair, Care After Refer to this sheet in the next few weeks. These instructions provide you with information on caring for yourself after your procedure. Your health care provider may also give you more specific instructions. Your treatment has been planned according to current medical practices, but problems sometimes occur. Call your health care provider if you have any problems or questions after your procedure. What can I expect after the procedure? After the procedure, it is common to have pain in the affected area. Follow these instructions at home:  Take medicines only as directed by your health care provider.  Do not drive or operate heavy machinery while taking pain medicine.  You may resume your normal activities as allowed by your health care provider.  Do not take baths, swim, or use a hot tub until your health care provider approves.  If you have a surgical shoe, wear it as directed by your health care provider. Remove it only as directed by your health care provider.  If you have a splint: ? Wear it as directed by your health care provider. Remove it only as directed by your health care provider. ? Loosen it if your toes become numb and tingle, or if they turn cold and blue. ? Cover it with a watertight plastic bag to protect it from water while you take a bath or a shower. Do not let it get wet.  There are many different ways to close and cover an incision, including stitches, skin glue, and adhesive strips. Follow your health care provider's instructions about: ? Incision care. ? Bandage (dressing) changes and removal. ? Incision closure removal.  Check your incision  area every day for signs of infection. Watch for: ? Redness, swelling, or pain. ? Fluid, blood, or pus. Contact a health care provider if:  You have redness, swelling, or pain at your incision site.  You have blood, fluid, or pus coming from your incision.  You notice a bad smell coming from the incision area or the dressing.  You have a fever. Get help right away if:  You develop a rash.  You have difficulty breathing. This information is not intended to replace advice given to you by your health care provider. Make sure you discuss any questions you have with your health care provider. Document Released: 10/01/2004 Document Revised: 11/16/2015 Document Reviewed: 11/27/2013 Elsevier Interactive Patient Education  2018 [place] Anesthesia is a term that refers to techniques, procedures, and medicines that help a person stay safe and comfortable during a medical procedure. Monitored anesthesia care, or sedation, is one type of anesthesia. Your anesthesia specialist may recommend sedation if you will be having a procedure that does not require you  to be unconscious, such as:  Cataract surgery.  A dental procedure.  A biopsy.  A colonoscopy.  During the procedure, you may receive a medicine to help you relax (sedative). There are three levels of sedation:  Mild sedation. At this level, you may feel awake and relaxed. You will be able to follow directions.  Moderate sedation. At this level, you will be sleepy. You may not remember the procedure.  Deep sedation. At this level, you will be asleep. You will not remember the procedure.  The more medicine you are given, the deeper your level of sedation will be. Depending on how you respond to the procedure, the anesthesia specialist may change your level of sedation or the type of anesthesia to fit your needs. An anesthesia specialist will monitor you closely during the procedure. Let your health care  provider know about:  Any allergies you have.  All medicines you are taking, including vitamins, herbs, eye drops, creams, and over-the-counter medicines.  Any use of steroids (by mouth or as a cream).  Any problems you or family members have had with sedatives and anesthetic medicines.  Any blood disorders you have.  Any surgeries you have had.  Any medical conditions you have, such as sleep apnea.  Whether you are pregnant or may be pregnant.  Any use of cigarettes, alcohol, or street drugs. What are the risks? Generally, this is a safe procedure. However, problems may occur, including:  Getting too much medicine (oversedation).  Nausea.  Allergic reaction to medicines.  Trouble breathing. If this happens, a breathing tube may be used to help with breathing. It will be removed when you are awake and breathing on your own.  Heart trouble.  Lung trouble.  Before the procedure Staying hydrated Follow instructions from your health care provider about hydration, which may include:  Up to 2 hours before the procedure - you may continue to drink clear liquids, such as water, clear fruit juice, black coffee, and plain tea.  Eating and drinking restrictions Follow instructions from your health care provider about eating and drinking, which may include:  8 hours before the procedure - stop eating heavy meals or foods such as meat, fried foods, or fatty foods.  6 hours before the procedure - stop eating light meals or foods, such as toast or cereal.  6 hours before the procedure - stop drinking milk or drinks that contain milk.  2 hours before the procedure - stop drinking clear liquids.  Medicines Ask your health care provider about:  Changing or stopping your regular medicines. This is especially important if you are taking diabetes medicines or blood thinners.  Taking medicines such as aspirin and ibuprofen. These medicines can thin your blood. Do not take these  medicines before your procedure if your health care provider instructs you not to.  Tests and exams  You will have a physical exam.  You may have blood tests done to show: ? How well your kidneys and liver are working. ? How well your blood can clot.  General instructions  Plan to have someone take you home from the hospital or clinic.  If you will be going home right after the procedure, plan to have someone with you for 24 hours.  What happens during the procedure?  Your blood pressure, heart rate, breathing, level of pain and overall condition will be monitored.  An IV tube will be inserted into one of your veins.  Your anesthesia specialist will give you medicines as  needed to keep you comfortable during the procedure. This may mean changing the level of sedation.  The procedure will be performed. After the procedure  Your blood pressure, heart rate, breathing rate, and blood oxygen level will be monitored until the medicines you were given have worn off.  Do not drive for 24 hours if you received a sedative.  You may: ? Feel sleepy, clumsy, or nauseous. ? Feel forgetful about what happened after the procedure. ? Have a sore throat if you had a breathing tube during the procedure. ? Vomit. This information is not intended to replace advice given to you by your health care provider. Make sure you discuss any questions you have with your health care provider. Document Released: 12/08/2004 Document Revised: 08/21/2015 Document Reviewed: 07/05/2015 Elsevier Interactive Patient Education  2018 [place], Care After These instructions provide you with information about caring for yourself after your procedure. Your health care provider may also give you more specific instructions. Your treatment has been planned according to current medical practices, but problems sometimes occur. Call your health care provider if you have any problems or questions  after your procedure. What can I expect after the procedure? After your procedure, it is common to:  Feel sleepy for several hours.  Feel clumsy and have poor balance for several hours.  Feel forgetful about what happened after the procedure.  Have poor judgment for several hours.  Feel nauseous or vomit.  Have a sore throat if you had a breathing tube during the procedure.  Follow these instructions at home: For at least 24 hours after the procedure:   Do not: ? Participate in activities in which you could fall or become injured. ? Drive. ? Use heavy machinery. ? Drink alcohol. ? Take sleeping pills or medicines that cause drowsiness. ? Make important decisions or sign legal documents. ? Take care of children on your own.  Rest. Eating and drinking  Follow the diet that is recommended by your health care provider.  If you vomit, drink water, juice, or soup when you can drink without vomiting.  Make sure you have little or no nausea before eating solid foods. General instructions  Have a responsible adult stay with you until you are awake and alert.  Take over-the-counter and prescription medicines only as told by your health care provider.  If you smoke, do not smoke without supervision.  Keep all follow-up visits as told by your health care provider. This is important. Contact a health care provider if:  You keep feeling nauseous or you keep vomiting.  You feel light-headed.  You develop a rash.  You have a fever. Get help right away if:  You have trouble breathing. This information is not intended to replace advice given to you by your health care provider. Make sure you discuss any questions you have with your health care provider. Document Released: 07/05/2015 Document Revised: 11/04/2015 Document Reviewed: 07/05/2015 Elsevier Interactive Patient Education  [employer].

## 2017-04-17 ENCOUNTER — Encounter (HOSPITAL_COMMUNITY)
Admission: RE | Admit: 2017-04-17 | Discharge: 2017-04-17 | Disposition: A | Discharge status: Home / Self Care | Payer: PPO | Source: Ambulatory Visit | Attending: Podiatry | Admitting: Podiatry

## 2017-04-17 ENCOUNTER — Encounter (HOSPITAL_COMMUNITY): Payer: Self-pay

## 2017-04-19 ENCOUNTER — Ambulatory Visit (HOSPITAL_COMMUNITY): Admission: RE | Admit: 2017-04-19 | Payer: PPO | Source: Ambulatory Visit

## 2017-04-19 ENCOUNTER — Encounter (HOSPITAL_COMMUNITY): Admission: RE | Payer: Self-pay | Source: Ambulatory Visit

## 2017-04-19 SURGERY — ARTHROPLASTY, TOE
Anesthesia: Monitor Anesthesia Care | Laterality: Right

## 2017-05-04 DIAGNOSIS — I10 Essential (primary) hypertension: Secondary | ICD-10-CM | POA: Diagnosis not present

## 2017-05-04 DIAGNOSIS — E119 Type 2 diabetes mellitus without complications: Secondary | ICD-10-CM | POA: Diagnosis not present

## 2017-05-04 DIAGNOSIS — Z79899 Other long term (current) drug therapy: Secondary | ICD-10-CM | POA: Diagnosis not present

## 2017-05-04 DIAGNOSIS — E785 Hyperlipidemia, unspecified: Secondary | ICD-10-CM | POA: Diagnosis not present

## 2017-05-11 DIAGNOSIS — E119 Type 2 diabetes mellitus without complications: Secondary | ICD-10-CM | POA: Diagnosis not present

## 2017-05-11 DIAGNOSIS — Z6827 Body mass index (BMI) 27.0-27.9, adult: Secondary | ICD-10-CM | POA: Diagnosis not present

## 2017-05-11 DIAGNOSIS — G309 Alzheimer's disease, unspecified: Secondary | ICD-10-CM | POA: Diagnosis not present

## 2017-05-11 DIAGNOSIS — E785 Hyperlipidemia, unspecified: Secondary | ICD-10-CM | POA: Diagnosis not present

## 2017-09-07 DIAGNOSIS — E119 Type 2 diabetes mellitus without complications: Secondary | ICD-10-CM | POA: Diagnosis not present

## 2017-09-14 DIAGNOSIS — G309 Alzheimer's disease, unspecified: Secondary | ICD-10-CM | POA: Diagnosis not present

## 2017-09-14 DIAGNOSIS — I10 Essential (primary) hypertension: Secondary | ICD-10-CM | POA: Diagnosis not present

## 2017-09-14 DIAGNOSIS — E119 Type 2 diabetes mellitus without complications: Secondary | ICD-10-CM | POA: Diagnosis not present

## 2017-12-04 DIAGNOSIS — Z111 Encounter for screening for respiratory tuberculosis: Secondary | ICD-10-CM | POA: Diagnosis not present

## 2017-12-12 ENCOUNTER — Ambulatory Visit (INDEPENDENT_AMBULATORY_CARE_PROVIDER_SITE_OTHER): Payer: PPO | Admitting: Internal Medicine

## 2017-12-12 ENCOUNTER — Encounter: Payer: Self-pay | Admitting: Internal Medicine

## 2017-12-12 DIAGNOSIS — G301 Alzheimer's disease with late onset: Secondary | ICD-10-CM

## 2017-12-12 DIAGNOSIS — I10 Essential (primary) hypertension: Secondary | ICD-10-CM

## 2017-12-12 DIAGNOSIS — E119 Type 2 diabetes mellitus without complications: Secondary | ICD-10-CM | POA: Diagnosis not present

## 2017-12-12 DIAGNOSIS — E7800 Pure hypercholesterolemia, unspecified: Secondary | ICD-10-CM | POA: Diagnosis not present

## 2017-12-12 DIAGNOSIS — K219 Gastro-esophageal reflux disease without esophagitis: Secondary | ICD-10-CM | POA: Diagnosis not present

## 2017-12-12 DIAGNOSIS — R350 Frequency of micturition: Secondary | ICD-10-CM | POA: Diagnosis not present

## 2017-12-12 DIAGNOSIS — F0280 Dementia in other diseases classified elsewhere without behavioral disturbance: Secondary | ICD-10-CM | POA: Diagnosis not present

## 2017-12-12 DIAGNOSIS — N401 Enlarged prostate with lower urinary tract symptoms: Secondary | ICD-10-CM | POA: Diagnosis not present

## 2017-12-12 NOTE — Assessment & Plan Note (Signed)
HX of stricture Continue Pantoprazole for now

## 2017-12-12 NOTE — Progress Notes (Signed)
HPI  Pt presents to the clinic today to establish care and for management of the conditions listed below. He is transferring care from Dr. Willey Blade.  BPH: He has frequent urination and nocturia. He voids fine with Finasteride and Flomax. He does not follow with urology.  DM 2: They are not sure the last time his A1C was checked, but his wife reports it has been years. He reports this is diet controlled. He checks his feet as needed. His last eye exam was more than 2 years ago.  GERD: Triggered by esophageal stricture. He takes Pantoprazole as prescribed and denies breakthrough symptoms. He had his esophagus dilated last in 2013. Upper GI from 08/2006 reviewed.  HTN: His BP today is 110/62. He is taking Amlodipine and Losartan as prescribed. ECG from 04/2011 reviewed.  HLD: He denies myalgias on Simvastatin. He consumes a low fat diet.  Alzheimer's: They are moving to [facility]. He has short term memory loss, confusion. He needs assistance with bathing, dressing and toileting. His wife has a Building services engineer that comes in and helps. He is walking without device, but shuffles his feet and has trouble with balance. Wife denies recent falls.  Flu: 11/2017 Tetanus: unsure Pneumovax: unsure Prevnar: unsure Zostovax: unsure Shingrix: unsure Vision Screening: as needed Dentist: as needed  Past Medical History:  Diagnosis Date  . Benign prostatic hypertrophy   . Cerebrovascular disease    plaque w/o focal disease on duplex scanning 11/08  . Conduction disorder of the heart    Junctional rhythm and sinus bradycardia with diltiazem   . Diabetes mellitus    diet controlled   . Hypertension    Negative coronary angiography-approximately 2000  . Syncope 1998   assoc with PSVT  . Ventricular tachycardia (HCC)    exercise induced    Current Outpatient Medications  Medication Sig Dispense Refill  . amLODipine (NORVASC) 5 MG tablet Take 5 mg by mouth daily.     Marland Kitchen aspirin 81 MG EC tablet Take 81 mg  by mouth daily.      . clobetasol cream (TEMOVATE) 0.25 % Apply 1 application topically as needed.     . finasteride (PROSCAR) 5 MG tablet Take 5 mg by mouth daily.      Marland Kitchen losartan (COZAAR) 50 MG tablet Take 50 mg by mouth daily.    . pantoprazole (PROTONIX) 40 MG tablet TAKE (1) TABLET BY MOUTH ONCE DAILY. 30 tablet 2  . polyethylene glycol (MIRALAX / GLYCOLAX) packet Take 17 g by mouth daily.    . simvastatin (ZOCOR) 20 MG tablet Take 20 mg by mouth daily.    . Tamsulosin HCl (FLOMAX) 0.4 MG CAPS Take 0.4 mg by mouth daily.     No current facility-administered medications for this visit.     Allergies  Allergen Reactions  . Diltiazem Hcl Other (See Comments)    REACTION: Bradyarrhythmia    No family history on file.  Social History   Socioeconomic History  . Marital status: Married    Spouse name: Not on file  . Number of children: Not on file  . Years of education: Not on file  . Highest education level: Not on file  Occupational History  . Not on file  Social Needs  . Financial resource strain: Not on file  . Food insecurity:    Worry: Not on file    Inability: Not on file  . Transportation needs:    Medical: Not on file    Non-medical: Not on  file  Tobacco Use  . Smoking status: Former Smoker    Packs/day: 0.50    Years: 3.00    Pack years: 1.50    Types: Cigarettes    Last attempt to quit: 04/11/1963    Years since quitting: 54.7  . Smokeless tobacco: Never Used  Substance and Sexual Activity  . Alcohol use: No  . Drug use: No  . Sexual activity: Not on file  Lifestyle  . Physical activity:    Days per week: Not on file    Minutes per session: Not on file  . Stress: Not on file  Relationships  . Social connections:    Talks on phone: Not on file    Gets together: Not on file    Attends religious service: Not on file    Active member of club or organization: Not on file    Attends meetings of clubs or organizations: Not on file    Relationship  status: Not on file  . Intimate partner violence:    Fear of current or ex partner: Not on file    Emotionally abused: Not on file    Physically abused: Not on file    Forced sexual activity: Not on file  Other Topics Concern  . Not on file  Social History Narrative   Married, 1 child; retired; gets regular exercise.     ROS:  Constitutional: Denies fever, malaise, fatigue, headache or abrupt weight changes.  HEENT: Denies eye pain, eye redness, ear pain, ringing in the ears, wax buildup, runny nose, nasal congestion, bloody nose, or sore throat. Respiratory: Denies difficulty breathing, shortness of breath, cough or sputum production.   Cardiovascular: Denies chest pain, chest tightness, palpitations or swelling in the hands or feet.  Gastrointestinal: Denies abdominal pain, bloating, constipation, diarrhea or blood in the stool.  GU: Pt reports frequent urination and nocturia. Denies urgency, pain with urination, blood in urine, odor or discharge. Musculoskeletal: Denies decrease in range of motion, difficulty with gait, muscle pain or joint pain and swelling.  Skin: Denies redness, rashes, lesions or ulcercations.  Neurological: Pt reports difficulty with memory, balance and coordination. Denies dizziness, difficulty with speech.  Psych: Denies anxiety, depression, SI/HI.  No other specific complaints in a complete review of systems (except as listed in HPI above).  PE:  BP 110/62   Pulse (!) 58   Temp 97.8 F (36.6 C) (Oral)   Wt 181 lb (82.1 kg)   SpO2 97%   BMI 25.97 kg/m   Wt Readings from Last 3 Encounters:  08/28/12 185 lb 1.6 oz (84 kg)  01/25/12 187 lb (84.8 kg)  01/23/12 187 lb 12.8 oz (85.2 kg)    General: Appears his stated age, in NAD. Neck: Neck supple, trachea midline. No masses, lumps or thyromegaly present.  Cardiovascular: Normal rate and rhythm. S1,S2 noted.  No murmur, rubs or gallops noted. No JVD or BLE edema. No carotid bruits  noted. Pulmonary/Chest: Normal effort and positive vesicular breath sounds. No respiratory distress. No wheezes, rales or ronchi noted.  Abdomen: Soft and nontender. Normal bowel sounds. Musculoskeletal: Gait slow, unsteady. He seems slightly bradykinesic. Neurological: Alert and oriented to person only. Psychiatric: Mood and affect flat. Behavior is normal. Judgment and thought content normal.    BMET    Component Value Date/Time   NA 136 01/25/2012 0928   K 4.3 01/25/2012 0928   CL 99 01/25/2012 0928   CO2 29 01/25/2012 0928   GLUCOSE 159 (H) 01/25/2012 8676  BUN 22 01/25/2012 0928   CREATININE 1.04 01/25/2012 0928   CALCIUM 9.6 01/25/2012 0928   GFRNONAA 65 (L) 01/25/2012 0928   GFRAA 76 (L) 01/25/2012 0928    Lipid Panel     Component Value Date/Time   CHOL 122 05/18/2009   TRIG 98 05/18/2009   TRIG 76 05/05/2008   HDL 42 05/18/2009   LDLCALC 60 05/18/2009    CBC    Component Value Date/Time   WBC 11.5 (H) 06/13/2008 1251   RBC 4.18 (L) 06/13/2008 1251   HGB 12.1 (L) 01/25/2012 0928   HCT 35.5 (L) 01/25/2012 0928   PLT 220 06/13/2008 1251   MCV 92.0 06/13/2008 1251   MCHC 34.7 06/13/2008 1251   RDW 12.4 06/13/2008 1251   LYMPHSABS 0.4 (L) 06/13/2008 1251   MONOABS 0.5 06/13/2008 1251   EOSABS 0.0 06/13/2008 1251   BASOSABS 0.0 06/13/2008 1251    Hgb A1C Lab Results  Component Value Date   HGBA1C 140 05/18/2009     Assessment and Plan:

## 2017-12-12 NOTE — Patient Instructions (Signed)

## 2017-12-12 NOTE — Assessment & Plan Note (Signed)
Diet controlled Will check A1C yearly

## 2017-12-12 NOTE — Assessment & Plan Note (Signed)
Continue Simvastatin for now Will check lipid panel annually

## 2017-12-12 NOTE — Assessment & Plan Note (Signed)
Continue Flomax and Finasteride Will monitor 

## 2017-12-12 NOTE — Assessment & Plan Note (Signed)
On the low end Will stop Amlodipine Continue Losartan  Reinforced DASH diet  Will see him in 2 weeks for BP follow up at the [facility] at [facility]

## 2017-12-12 NOTE — Assessment & Plan Note (Signed)
Continue Aricept and Namenda Will be moving to [facility] next week

## 2017-12-16 ENCOUNTER — Emergency Department (HOSPITAL_COMMUNITY)
Admission: EM | Admit: 2017-12-16 | Discharge: 2017-12-16 | Disposition: A | Discharge status: Home / Self Care | Payer: PPO | Attending: Emergency Medicine | Admitting: Emergency Medicine

## 2017-12-16 ENCOUNTER — Emergency Department (HOSPITAL_COMMUNITY): Payer: PPO

## 2017-12-16 ENCOUNTER — Other Ambulatory Visit: Payer: Self-pay

## 2017-12-16 ENCOUNTER — Encounter (HOSPITAL_COMMUNITY): Payer: Self-pay | Admitting: Emergency Medicine

## 2017-12-16 DIAGNOSIS — R1030 Lower abdominal pain, unspecified: Secondary | ICD-10-CM | POA: Diagnosis not present

## 2017-12-16 DIAGNOSIS — Z87891 Personal history of nicotine dependence: Secondary | ICD-10-CM | POA: Diagnosis not present

## 2017-12-16 DIAGNOSIS — E119 Type 2 diabetes mellitus without complications: Secondary | ICD-10-CM | POA: Insufficient documentation

## 2017-12-16 DIAGNOSIS — E871 Hypo-osmolality and hyponatremia: Secondary | ICD-10-CM | POA: Diagnosis not present

## 2017-12-16 DIAGNOSIS — K56600 Partial intestinal obstruction, unspecified as to cause: Secondary | ICD-10-CM | POA: Diagnosis not present

## 2017-12-16 LAB — URINALYSIS, ROUTINE W REFLEX MICROSCOPIC
Bilirubin Urine: NEGATIVE
GLUCOSE, UA: NEGATIVE mg/dL
HGB URINE DIPSTICK: NEGATIVE
Ketones, ur: NEGATIVE mg/dL
LEUKOCYTES UA: NEGATIVE
Nitrite: NEGATIVE
PH: 6 (ref 5.0–8.0)
Protein, ur: NEGATIVE mg/dL
Specific Gravity, Urine: 1.045 — ABNORMAL HIGH (ref 1.005–1.030)

## 2017-12-16 LAB — COMPREHENSIVE METABOLIC PANEL
ALBUMIN: 4.1 g/dL (ref 3.5–5.0)
ALT: 18 U/L (ref 0–44)
AST: 22 U/L (ref 15–41)
Alkaline Phosphatase: 60 U/L (ref 38–126)
Anion gap: 8 (ref 5–15)
BUN: 23 mg/dL (ref 8–23)
CHLORIDE: 95 mmol/L — AB (ref 98–111)
CO2: 26 mmol/L (ref 22–32)
CREATININE: 1.3 mg/dL — AB (ref 0.61–1.24)
Calcium: 9.2 mg/dL (ref 8.9–10.3)
GFR calc Af Amer: 55 mL/min — ABNORMAL LOW (ref >60)
GFR, EST NON AFRICAN AMERICAN: 48 mL/min — AB (ref >60)
Glucose, Bld: 148 mg/dL — ABNORMAL HIGH (ref 70–99)
POTASSIUM: 4.4 mmol/L (ref 3.5–5.1)
Sodium: 129 mmol/L — ABNORMAL LOW (ref 135–145)
Total Bilirubin: 0.6 mg/dL (ref 0.3–1.2)
Total Protein: 6.7 g/dL (ref 6.5–8.1)

## 2017-12-16 LAB — CBC
HEMATOCRIT: 34.4 % — AB (ref 39.0–52.0)
Hemoglobin: 11.7 g/dL — ABNORMAL LOW (ref 13.0–17.0)
MCH: 32.7 pg (ref 26.0–34.0)
MCHC: 34 g/dL (ref 30.0–36.0)
MCV: 96.1 fL (ref 78.0–100.0)
PLATELETS: 184 10*3/uL (ref 150–400)
RBC: 3.58 MIL/uL — AB (ref 4.22–5.81)
RDW: 12.2 % (ref 11.5–15.5)
WBC: 8.3 10*3/uL (ref 4.0–10.5)

## 2017-12-16 LAB — LIPASE, BLOOD: LIPASE: 25 U/L (ref 11–51)

## 2017-12-16 MED ORDER — DICYCLOMINE HCL 10 MG PO CAPS: 10.0000 mg | ORAL_CAPSULE | Freq: Two times a day (BID) | ORAL | 0 refills | Status: AC | PRN

## 2017-12-16 MED ORDER — SODIUM CHLORIDE 0.9 % IV BOLUS
1000.0000 mL | Freq: Once | INTRAVENOUS | Status: AC
  Administered 2017-12-16: 1000 mL via INTRAVENOUS

## 2017-12-16 MED ORDER — ACETAMINOPHEN 325 MG PO TABS
650.0000 mg | ORAL_TABLET | Freq: Once | ORAL | Status: AC
  Administered 2017-12-16: 650 mg via ORAL
  Filled 2017-12-16: qty 2

## 2017-12-16 MED ORDER — IOPAMIDOL (ISOVUE-300) INJECTION 61%
100.0000 mL | Freq: Once | INTRAVENOUS | Status: AC | PRN
  Administered 2017-12-16: 100 mL via INTRAVENOUS

## 2017-12-16 NOTE — ED Triage Notes (Signed)
Pt is having abd pain denies n/v/d

## 2017-12-16 NOTE — ED Notes (Signed)
Bladder scan 136cc, pt states he does not need to urinate at this time, aware of DO

## 2017-12-16 NOTE — ED Provider Notes (Signed)
[facility] Emergency Department Provider Note MRN:  [id number]  Arrival date & time: 12/16/17     Chief Complaint   Abdominal Pain   History of Present Illness   Patrick [name] is a 84 y.o. year-old male with a history of Alzheimer's, diabetes, hypertension, BPH presenting to the ED with chief complaint of abdominal pain.  The pain is been present for 2 to 3 weeks, typically is intermittent but has become more severe and more constant for the past 1 to 2 days.  The pain is located in the lower quadrants, right greater than left.  No new associated symptoms.  Patient has been having frequent urination throughout the night.  Normal BMs, no fevers, no cough, no chest pain or shortness of breath.  No exacerbating or alleviating factors.  Review of Systems  A complete 10 system review of systems was obtained and all systems are negative except as noted in the HPI and PMH.   Patient's Health History    Past Medical History:  Diagnosis Date  . Benign prostatic hypertrophy   . Cerebrovascular disease    plaque w/o focal disease on duplex scanning 11/08  . Conduction disorder of the heart    Junctional rhythm and sinus bradycardia with diltiazem   . Diabetes mellitus    diet controlled   . Hypertension    Negative coronary angiography-approximately 2000  . Syncope 1998   assoc with PSVT  . Ventricular tachycardia ([place])    exercise induced    Past Surgical History:  Procedure Laterality Date  . BALLOON DILATION  10/13/2011   Procedure: BALLOON DILATION;  Surgeon: Rogene Houston, MD;  Location: [facility];  Service: Endoscopy;  Laterality: N/A;  . CATARACT EXTRACTION W/PHACO  02/02/2012   Procedure: CATARACT EXTRACTION PHACO AND INTRAOCULAR LENS PLACEMENT (IOC);  Surgeon: Tonny Branch, MD;  Location: [facility];  Service: Ophthalmology;  Laterality: Right;  CDE:31.31  . CATARACT EXTRACTION W/PHACO  02/20/2012   Procedure: CATARACT EXTRACTION PHACO AND INTRAOCULAR LENS  PLACEMENT (IOC);  Surgeon: Tonny Branch, MD;  Location: [facility];  Service: Ophthalmology;  Laterality: Left;  CDE:  27.97  . COLONOSCOPY    . MALONEY DILATION  10/13/2011   Procedure: MALONEY DILATION;  Surgeon: Rogene Houston, MD;  Location: [facility];  Service: Endoscopy;  Laterality: N/A;  . SAVORY DILATION  10/13/2011   Procedure: SAVORY DILATION;  Surgeon: Rogene Houston, MD;  Location: [facility];  Service: Endoscopy;  Laterality: N/A;    No family history on file.  Social History   Socioeconomic History  . Marital status: Married    Spouse name: Not on file  . Number of children: Not on file  . Years of education: Not on file  . Highest education level: Not on file  Occupational History  . Not on file  Social Needs  . Financial resource strain: Not on file  . Food insecurity:    Worry: Not on file    Inability: Not on file  . Transportation needs:    Medical: Not on file    Non-medical: Not on file  Tobacco Use  . Smoking status: Former Smoker    Packs/day: 0.50    Years: 3.00    Pack years: 1.50    Types: Cigarettes    Last attempt to quit: 04/11/1963    Years since quitting: 54.7  . Smokeless tobacco: Never Used  Substance and Sexual Activity  . Alcohol use: No  .  Drug use: No  . Sexual activity: Not on file  Lifestyle  . Physical activity:    Days per week: Not on file    Minutes per session: Not on file  . Stress: Not on file  Relationships  . Social connections:    Talks on phone: Not on file    Gets together: Not on file    Attends religious service: Not on file    Active member of club or organization: Not on file    Attends meetings of clubs or organizations: Not on file    Relationship status: Not on file  . Intimate partner violence:    Fear of current or ex partner: Not on file    Emotionally abused: Not on file    Physically abused: Not on file    Forced sexual activity: Not on file  Other Topics Concern  . Not on file  Social History  Narrative   Married, 1 child; retired; gets regular exercise.      Physical Exam  Vital Signs and Nursing Notes reviewed Vitals:   12/16/17 1217  BP: (!) 101/51  Pulse: 70  Resp: 16  Temp: 97.6 F (36.4 C)  SpO2: 100%    CONSTITUTIONAL: Well-appearing, NAD NEURO:  Alert, oriented to name, moving all extremities EYES:  eyes equal and reactive ENT/NECK:  no LAD, no JVD CARDIO: Regular rate, well-perfused, normal S1 and S2 PULM:  CTAB no wheezing or rhonchi GI/GU:  normal bowel sounds, non-distended, non-tender MSK/SPINE:  No gross deformities, no edema SKIN:  no rash, atraumatic PSYCH:  Appropriate speech and behavior  Diagnostic and Interventional Summary    EKG Interpretation  Date/Time:    Ventricular Rate:    PR Interval:    QRS Duration:   QT Interval:    QTC Calculation:   R Axis:     Text Interpretation:        Labs Reviewed  COMPREHENSIVE METABOLIC PANEL - Abnormal; Notable for the following components:      Result Value   Sodium 129 (*)    Chloride 95 (*)    Glucose, Bld 148 (*)    Creatinine, Ser 1.30 (*)    GFR calc non Af Amer 48 (*)    GFR calc Af Amer 55 (*)    All other components within normal limits  CBC - Abnormal; Notable for the following components:   RBC 3.58 (*)    Hemoglobin 11.7 (*)    HCT 34.4 (*)    All other components within normal limits  URINALYSIS, ROUTINE W REFLEX MICROSCOPIC - Abnormal; Notable for the following components:   APPearance HAZY (*)    Specific Gravity, Urine 1.045 (*)    All other components within normal limits  LIPASE, BLOOD    CT ABDOMEN PELVIS W CONTRAST  Final Result      Medications  sodium chloride 0.9 % bolus 1,000 mL (1,000 mLs Intravenous New Bag/Given 12/16/17 1410)  acetaminophen (TYLENOL) tablet 650 mg (650 mg Oral Given 12/16/17 1251)  iopamidol (ISOVUE-300) 61 % injection 100 mL (100 mLs Intravenous Contrast Given 12/16/17 1337)     Procedures Critical Care  ED Course and Medical  Decision Making  I have reviewed the triage vital signs and the nursing notes.  Pertinent labs & imaging results that were available during my care of the patient were reviewed by me and considered in my medical decision making (see below for details).  Abdominal pain in this 84 year old male with no history of  abdominal issues, progressively worsening, considering appendicitis versus diverticulitis versus UTI versus urinary retention.  Labs, bladder scan, likely CT.  Labs revealed mild hyponatremia, question of AKI.  CT reveals partial small bowel obstruction.  However, patient is eating and drinking normally, with normal bowel movements.  Discussed case with Dr. Arnoldo Morale of general surgery, no procedural intervention is needed.  Shared decision-making was utilized with the patient and patient's wife.  Patient and wife agree that he will drink plenty of fluids, replete sodium at home, prefers managing this at home.  Has good follow-up with his PCP, can be seen within 1 week.  Given specific return precautions, patient and wife expressed understanding that this partial obstruction could become a full obstruction, made aware of what symptoms to look out for.  After the discussed management above, the patient was determined to be safe for discharge.  The patient was in agreement with this plan and all questions regarding their care were answered.  ED return precautions were discussed and the patient will return to the ED with any significant worsening of condition.  Barth Kirks. Sedonia Small, [place] [email or web address]  Final Clinical Impressions(s) / ED Diagnoses     ICD-10-CM   1. Lower abdominal pain R10.30   2. Partial small bowel obstruction (HCC) K56.600   3. Hyponatremia E87.1     ED Discharge Orders    None         Maudie Flakes, MD 12/16/17 [phone]

## 2017-12-16 NOTE — Discharge Instructions (Addendum)
You were evaluated in the Emergency Department and after careful evaluation, we did not find any emergent condition requiring admission or further testing in the hospital.  Your symptoms today seem to be due to a partial small bowel obstruction.  You also have some mild low sodium found on your lab work today.  Your CT imaging was discussed with our surgeons, and there is no intervention needed at this time.  Our hope is that this partial obstruction resolves on its own.  It is encouraging that you are still able to eat and drink normally and that you're having normal bowel movements.  It is important that you follow-up with your primary care provider within 1 week for reexamination to ensure that you are recovering.  You should return the emergency department with any significant nausea or vomiting, any decrease in your bowel movements or passage of gas, or any worsening pain.  Please drink more fluids at home, preferably Gatorade to replete your sodium.  Please return to the Emergency Department if you experience any worsening of your condition.  We encourage you to follow up with a primary care provider.  Thank you for allowing [place] to be a part of your care.

## 2017-12-20 DIAGNOSIS — K56600 Partial intestinal obstruction, unspecified as to cause: Secondary | ICD-10-CM | POA: Diagnosis not present

## 2017-12-20 DIAGNOSIS — I700 Atherosclerosis of aorta: Secondary | ICD-10-CM | POA: Diagnosis not present

## 2017-12-26 DIAGNOSIS — M6281 Muscle weakness (generalized): Secondary | ICD-10-CM | POA: Diagnosis not present

## 2017-12-26 DIAGNOSIS — F0150 Vascular dementia without behavioral disturbance: Secondary | ICD-10-CM | POA: Diagnosis not present

## 2017-12-26 DIAGNOSIS — R4189 Other symptoms and signs involving cognitive functions and awareness: Secondary | ICD-10-CM | POA: Diagnosis not present

## 2017-12-26 DIAGNOSIS — Z741 Need for assistance with personal care: Secondary | ICD-10-CM | POA: Diagnosis not present

## 2017-12-26 DIAGNOSIS — G301 Alzheimer's disease with late onset: Secondary | ICD-10-CM | POA: Diagnosis not present

## 2017-12-26 DIAGNOSIS — R2681 Unsteadiness on feet: Secondary | ICD-10-CM | POA: Diagnosis not present

## 2017-12-26 DIAGNOSIS — R278 Other lack of coordination: Secondary | ICD-10-CM | POA: Diagnosis not present

## 2017-12-26 DIAGNOSIS — G300 Alzheimer's disease with early onset: Secondary | ICD-10-CM | POA: Diagnosis not present

## 2017-12-26 DIAGNOSIS — N400 Enlarged prostate without lower urinary tract symptoms: Secondary | ICD-10-CM | POA: Diagnosis not present

## 2017-12-26 DIAGNOSIS — K219 Gastro-esophageal reflux disease without esophagitis: Secondary | ICD-10-CM | POA: Diagnosis not present

## 2017-12-26 DIAGNOSIS — F05 Delirium due to known physiological condition: Secondary | ICD-10-CM | POA: Diagnosis not present

## 2017-12-26 DIAGNOSIS — I639 Cerebral infarction, unspecified: Secondary | ICD-10-CM | POA: Diagnosis not present

## 2018-01-10 DIAGNOSIS — L89152 Pressure ulcer of sacral region, stage 2: Secondary | ICD-10-CM | POA: Diagnosis not present

## 2018-01-18 DIAGNOSIS — L89152 Pressure ulcer of sacral region, stage 2: Secondary | ICD-10-CM | POA: Diagnosis not present

## 2018-01-18 DIAGNOSIS — G300 Alzheimer's disease with early onset: Secondary | ICD-10-CM | POA: Diagnosis not present

## 2018-01-18 DIAGNOSIS — F0280 Dementia in other diseases classified elsewhere without behavioral disturbance: Secondary | ICD-10-CM | POA: Diagnosis not present

## 2018-01-18 DIAGNOSIS — M6281 Muscle weakness (generalized): Secondary | ICD-10-CM | POA: Diagnosis not present

## 2018-01-22 DIAGNOSIS — R109 Unspecified abdominal pain: Secondary | ICD-10-CM

## 2018-01-22 DIAGNOSIS — R1084 Generalized abdominal pain: Secondary | ICD-10-CM | POA: Diagnosis not present

## 2018-01-23 DIAGNOSIS — F0280 Dementia in other diseases classified elsewhere without behavioral disturbance: Secondary | ICD-10-CM | POA: Diagnosis not present

## 2018-01-23 DIAGNOSIS — G300 Alzheimer's disease with early onset: Secondary | ICD-10-CM | POA: Diagnosis not present

## 2018-01-23 DIAGNOSIS — M6281 Muscle weakness (generalized): Secondary | ICD-10-CM | POA: Diagnosis not present

## 2018-01-23 DIAGNOSIS — I639 Cerebral infarction, unspecified: Secondary | ICD-10-CM | POA: Diagnosis not present

## 2018-01-23 DIAGNOSIS — L89152 Pressure ulcer of sacral region, stage 2: Secondary | ICD-10-CM | POA: Diagnosis not present

## 2018-01-23 DIAGNOSIS — I480 Paroxysmal atrial fibrillation: Secondary | ICD-10-CM | POA: Diagnosis not present

## 2018-02-25 DEATH — deceased

## 2020-03-19 IMAGING — CT CT ABD-PELV W/ CM
2 of 5 series · 16 of 46 positions shown, 18 images · IV contrast (Isovue)
Comparison: None.

CLINICAL DATA: Lower abdominal pain for 1 day. Clinical suspicion
for diverticulitis.

EXAM:
CT ABDOMEN AND PELVIS WITH CONTRAST
TECHNIQUE: Multidetector CT imaging of the abdomen and pelvis was performed
using the standard protocol following bolus administration of
intravenous contrast.
CONTRAST:  100mL [id number] IOPAMIDOL ([id number]) INJECTION 61%

[Series 2: axial st · axial · 0.82mm/px · z∈[-318,+112]mm · 13 of 98 slices shown, 15 images]
[im 6/98  soft-tissue]
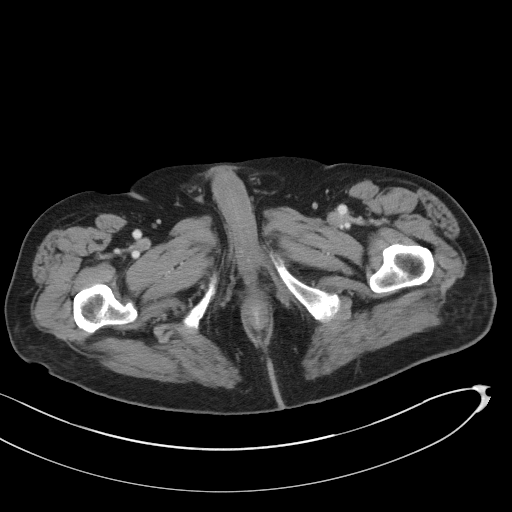
[im 6/98  bone]
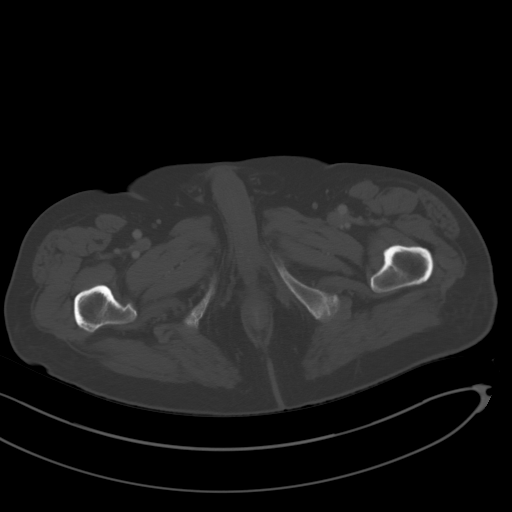
[im 12/98  soft-tissue]
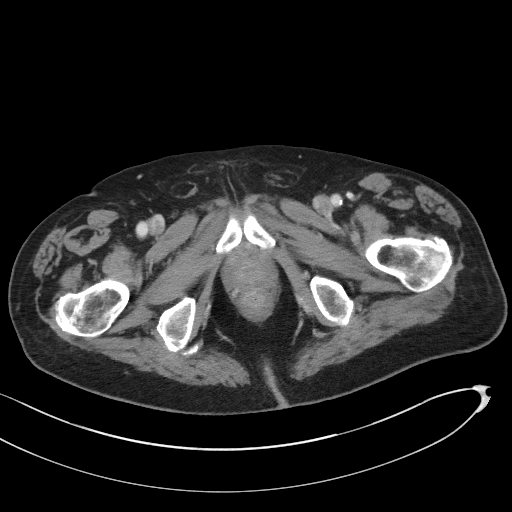
[im 23/98  soft-tissue]
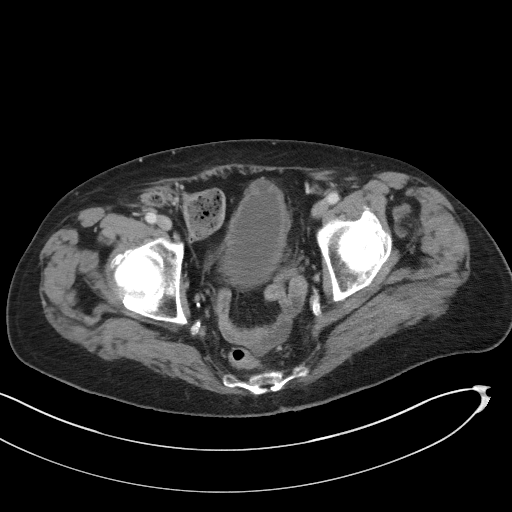
[im 29/98  soft-tissue]
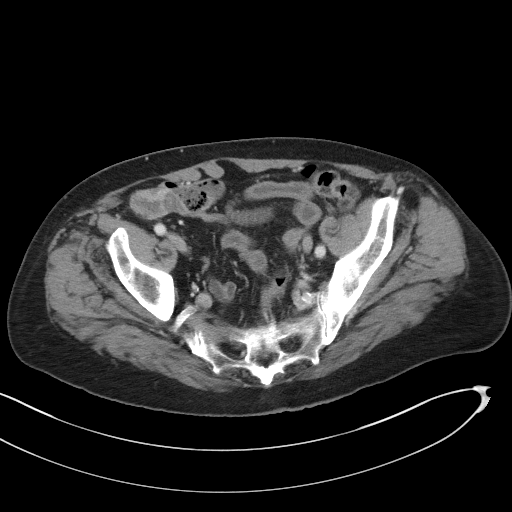
[im 35/98  soft-tissue]
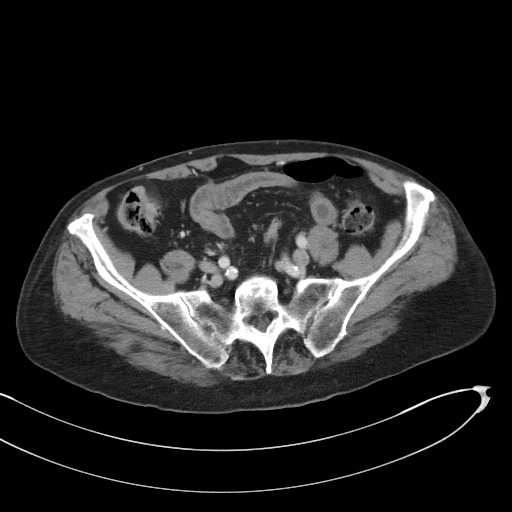
[im 40/98  soft-tissue]
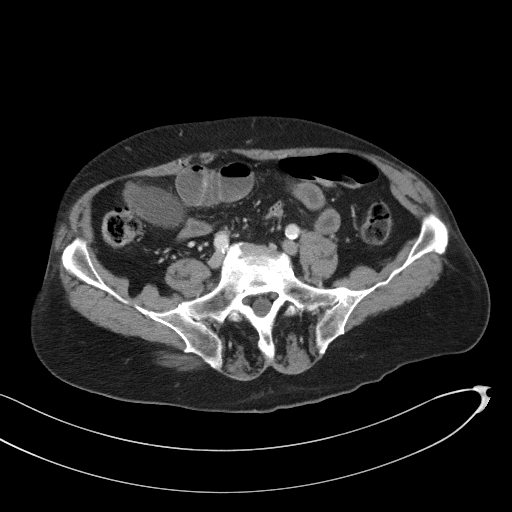
[im 52/98  soft-tissue]
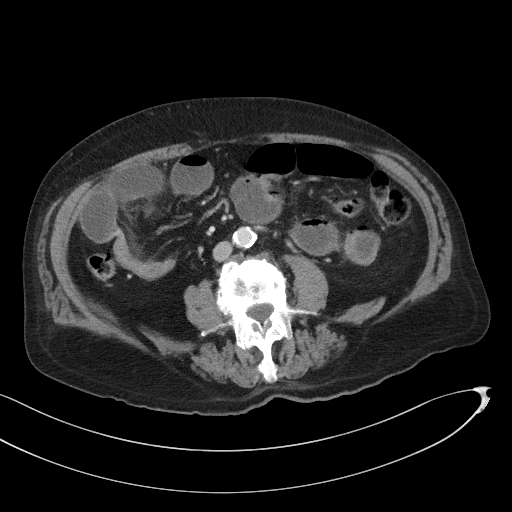
[im 58/98  soft-tissue]
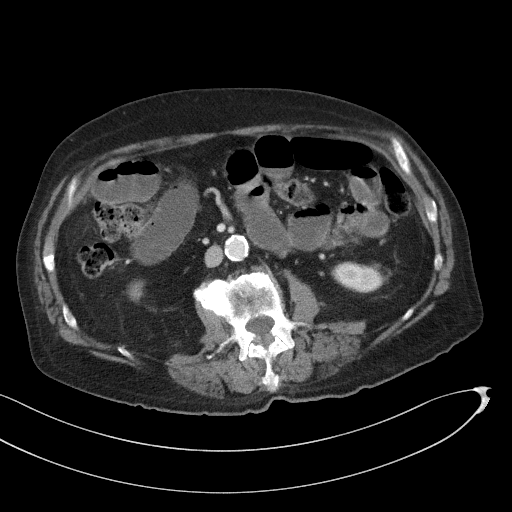
[im 63/98  soft-tissue]
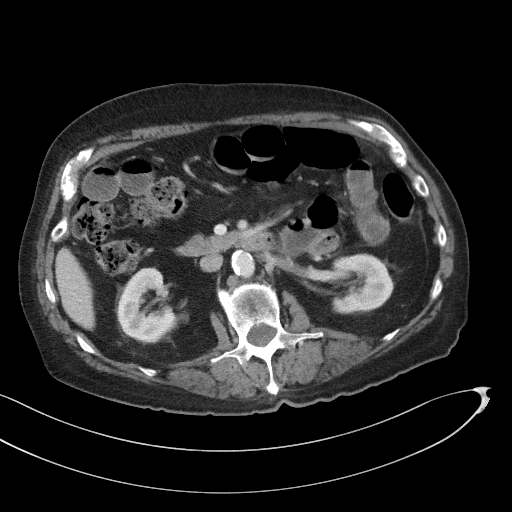
[im 63/98  bone]
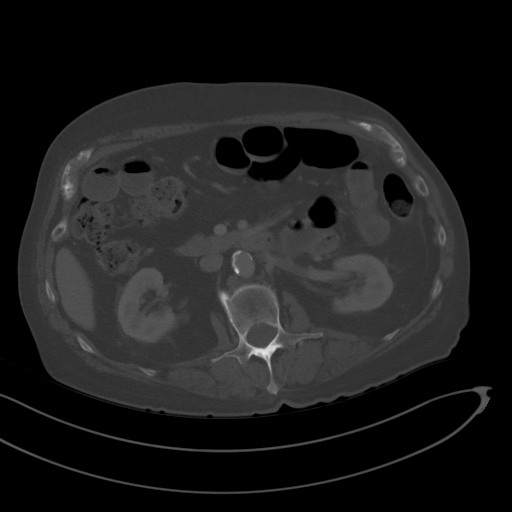
[im 69/98  soft-tissue]
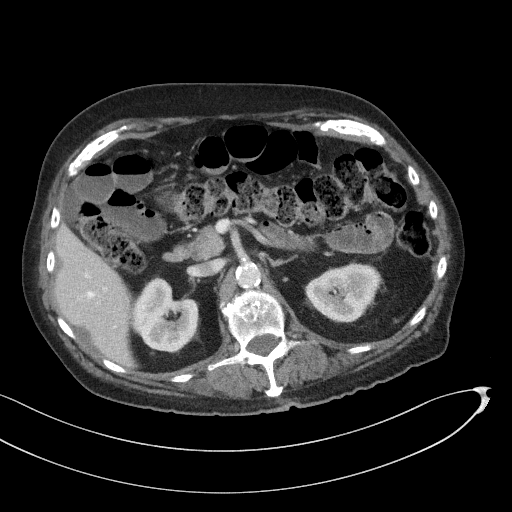
[im 75/98  soft-tissue]
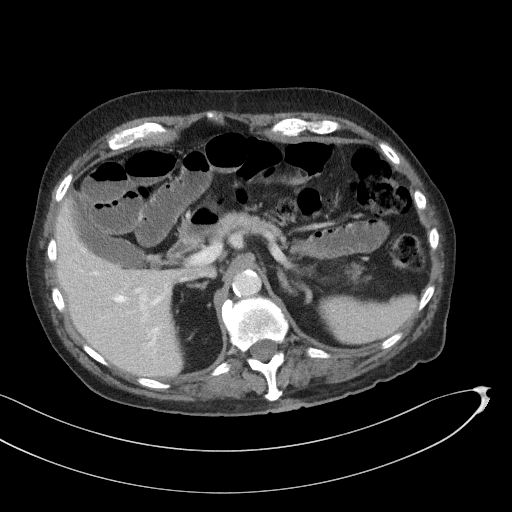
[im 86/98  soft-tissue]
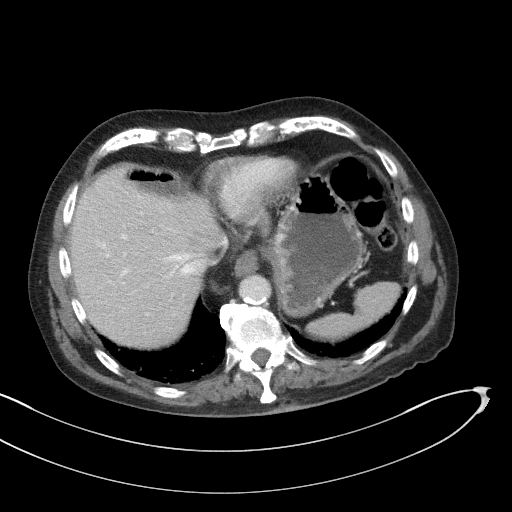
[im 92/98  soft-tissue]
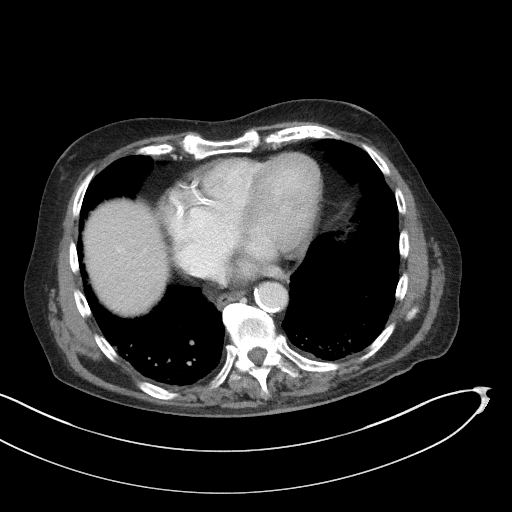

[Series 6: coronal st · coronal · 0.86mm/px · 3 of 90 slices shown]
[im 30/90  soft-tissue]
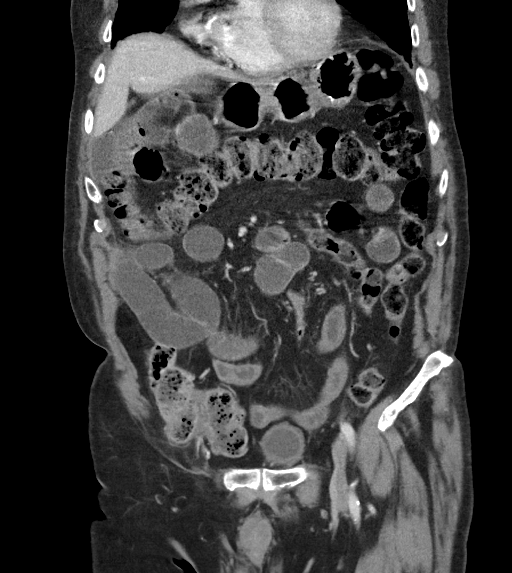
[im 40/90  soft-tissue]
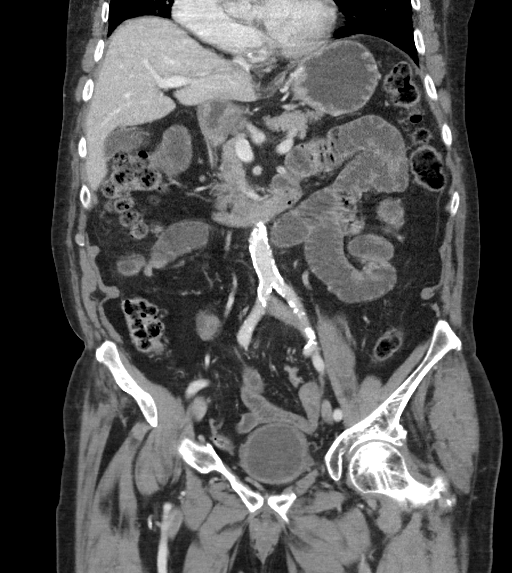
[im 50/90  soft-tissue]
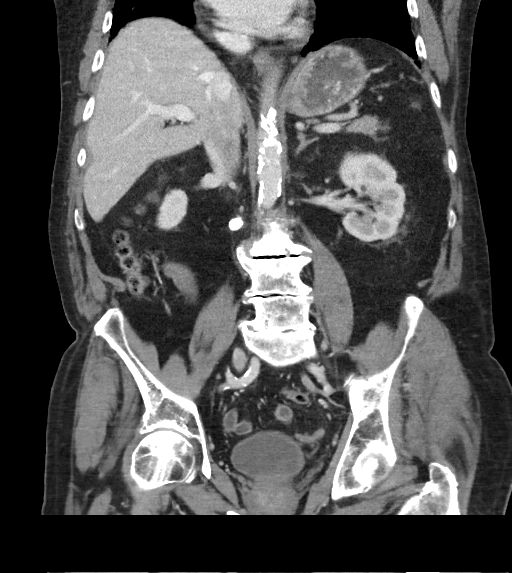

[16 of 46 positions shown; findings below may reference images not displayed]

FINDINGS: Lower chest: No acute abnormality.

Hepatobiliary: No focal liver abnormality is seen. No gallstones,
gallbladder wall thickening, or biliary dilatation.

Pancreas: Unremarkable. No pancreatic ductal dilatation or
surrounding inflammatory changes.

Spleen: Normal in size without focal abnormality.

Adrenals/Urinary Tract: No adrenal masses. There are 2 subcentimeter
low-density right renal masses consistent with cysts. No other renal
masses, no stones and no hydronephrosis. Ureters are normal in
course and in caliber. Bladder is unremarkable.

Stomach/Bowel: Stomach is unremarkable.

There is mild dilation of small bowel maximum diameter 3.4 cm. There
is a transition point to decompressed small bowel in the right mid
abdomen. No bowel wall thickening. No inflammatory change.

Colon is normal in caliber. No colonic wall thickening or
inflammation. No evidence of diverticulitis. No evidence of
appendicitis.

Vascular/Lymphatic: Aortic atherosclerosis. No enlarged abdominal or
pelvic lymph nodes.

Reproductive: Unremarkable.

Other: No abdominal wall hernia.  No ascites.

Musculoskeletal: No fracture or acute finding. No osteoblastic or
osteolytic lesions.
IMPRESSION: 1. Findings consistent with a partial small bowel obstruction,
transition point in the right mid abdomen.
2. No other acute abnormality.  No evidence of diverticulitis.
3. Aortic atherosclerosis.
